# Patient Record
Sex: Female | Born: 1980 | Race: Black or African American | Hispanic: No | Marital: Married | Smoking: Never smoker
Health system: Southern US, Community
[De-identification: ages and names within clinical notes are randomized; demographics above are authoritative.]

## PROBLEM LIST (undated history)

## (undated) ENCOUNTER — Emergency Department (HOSPITAL_COMMUNITY): Admission: EM | Payer: 59

## (undated) DIAGNOSIS — G43909 Migraine, unspecified, not intractable, without status migrainosus: Secondary | ICD-10-CM

## (undated) DIAGNOSIS — J45909 Unspecified asthma, uncomplicated: Secondary | ICD-10-CM

## (undated) HISTORY — PX: FOOT SURGERY: SHX648

## (undated) HISTORY — PX: TUBAL LIGATION: SHX77

---

## 2007-07-04 ENCOUNTER — Emergency Department (HOSPITAL_COMMUNITY): Admission: EM | Admit: 2007-07-04 | Discharge: 2007-07-04 | Payer: Self-pay | Admitting: Emergency Medicine

## 2007-08-22 ENCOUNTER — Emergency Department (HOSPITAL_COMMUNITY): Admission: EM | Admit: 2007-08-22 | Discharge: 2007-08-22 | Payer: Self-pay | Admitting: Emergency Medicine

## 2007-10-25 ENCOUNTER — Emergency Department (HOSPITAL_COMMUNITY): Admission: EM | Admit: 2007-10-25 | Discharge: 2007-10-25 | Payer: Self-pay | Admitting: Emergency Medicine

## 2007-11-11 ENCOUNTER — Ambulatory Visit: Payer: Self-pay | Admitting: Family Medicine

## 2007-11-29 ENCOUNTER — Inpatient Hospital Stay (HOSPITAL_COMMUNITY): Admission: RE | Admit: 2007-11-29 | Discharge: 2007-11-29 | Payer: Self-pay | Admitting: Obstetrics and Gynecology

## 2007-12-09 ENCOUNTER — Ambulatory Visit: Payer: Self-pay | Admitting: *Deleted

## 2008-02-06 ENCOUNTER — Ambulatory Visit (HOSPITAL_COMMUNITY): Admission: RE | Admit: 2008-02-06 | Discharge: 2008-02-06 | Payer: Self-pay | Admitting: Family Medicine

## 2008-04-21 ENCOUNTER — Ambulatory Visit: Payer: Self-pay | Admitting: Physician Assistant

## 2008-04-21 ENCOUNTER — Inpatient Hospital Stay (HOSPITAL_COMMUNITY): Admission: AD | Admit: 2008-04-21 | Discharge: 2008-04-21 | Payer: Self-pay | Admitting: Gynecology

## 2008-07-02 ENCOUNTER — Ambulatory Visit: Payer: Self-pay | Admitting: Family Medicine

## 2008-07-02 ENCOUNTER — Inpatient Hospital Stay (HOSPITAL_COMMUNITY): Admission: AD | Admit: 2008-07-02 | Discharge: 2008-07-04 | Payer: Self-pay | Admitting: Family Medicine

## 2008-09-25 ENCOUNTER — Emergency Department (HOSPITAL_COMMUNITY): Admission: EM | Admit: 2008-09-25 | Discharge: 2008-09-25 | Payer: Self-pay | Admitting: Emergency Medicine

## 2008-10-06 ENCOUNTER — Emergency Department (HOSPITAL_COMMUNITY): Admission: EM | Admit: 2008-10-06 | Discharge: 2008-10-06 | Payer: Self-pay | Admitting: Emergency Medicine

## 2008-12-14 ENCOUNTER — Ambulatory Visit: Payer: Self-pay | Admitting: Internal Medicine

## 2008-12-14 ENCOUNTER — Encounter: Payer: Self-pay | Admitting: Internal Medicine

## 2008-12-14 DIAGNOSIS — J45909 Unspecified asthma, uncomplicated: Secondary | ICD-10-CM | POA: Insufficient documentation

## 2009-02-26 ENCOUNTER — Ambulatory Visit: Payer: Self-pay | Admitting: Family Medicine

## 2009-04-08 ENCOUNTER — Ambulatory Visit (HOSPITAL_COMMUNITY): Admission: RE | Admit: 2009-04-08 | Discharge: 2009-04-08 | Payer: Self-pay | Admitting: General Surgery

## 2009-06-09 ENCOUNTER — Encounter (INDEPENDENT_AMBULATORY_CARE_PROVIDER_SITE_OTHER): Payer: Self-pay | Admitting: Adult Health

## 2009-06-09 ENCOUNTER — Ambulatory Visit: Payer: Self-pay | Admitting: Internal Medicine

## 2009-06-09 LAB — CONVERTED CEMR LAB
ALT: 9 units/L (ref 0–35)
Alkaline Phosphatase: 46 units/L (ref 39–117)
Basophils Absolute: 0 10*3/uL (ref 0.0–0.1)
Basophils Relative: 0 % (ref 0–1)
Creatinine, Ser: 0.72 mg/dL (ref 0.40–1.20)
Eosinophils Absolute: 0.1 10*3/uL (ref 0.0–0.7)
Glucose, Bld: 103 mg/dL — ABNORMAL HIGH (ref 70–99)
Hgb A1c MFr Bld: 6.3 % — ABNORMAL HIGH (ref 4.6–6.1)
MCHC: 31.5 g/dL (ref 30.0–36.0)
Neutro Abs: 2.8 10*3/uL (ref 1.7–7.7)
Neutrophils Relative %: 56 % (ref 43–77)
Platelets: 414 10*3/uL — ABNORMAL HIGH (ref 150–400)
RDW: 15 % (ref 11.5–15.5)
Sodium: 141 meq/L (ref 135–145)
T3 Uptake Ratio: 40.8 % — ABNORMAL HIGH (ref 22.5–37.0)
Total Bilirubin: 0.3 mg/dL (ref 0.3–1.2)
Total Protein: 7.3 g/dL (ref 6.0–8.3)

## 2009-09-13 ENCOUNTER — Ambulatory Visit: Payer: Self-pay | Admitting: Internal Medicine

## 2009-12-06 ENCOUNTER — Emergency Department (HOSPITAL_COMMUNITY): Admission: EM | Admit: 2009-12-06 | Discharge: 2009-12-06 | Payer: Self-pay | Admitting: Emergency Medicine

## 2010-01-02 ENCOUNTER — Emergency Department (HOSPITAL_COMMUNITY): Admission: EM | Admit: 2010-01-02 | Discharge: 2010-01-02 | Payer: Self-pay | Admitting: Emergency Medicine

## 2010-02-07 ENCOUNTER — Emergency Department (HOSPITAL_COMMUNITY): Admission: EM | Admit: 2010-02-07 | Discharge: 2010-02-07 | Payer: Self-pay | Admitting: Emergency Medicine

## 2010-02-14 ENCOUNTER — Ambulatory Visit: Payer: Self-pay | Admitting: Internal Medicine

## 2010-05-07 ENCOUNTER — Emergency Department (HOSPITAL_COMMUNITY): Admission: EM | Admit: 2010-05-07 | Discharge: 2010-05-07 | Payer: Self-pay | Admitting: Emergency Medicine

## 2010-09-21 ENCOUNTER — Emergency Department (HOSPITAL_COMMUNITY): Payer: Self-pay

## 2010-09-21 ENCOUNTER — Emergency Department (HOSPITAL_COMMUNITY)
Admission: EM | Admit: 2010-09-21 | Discharge: 2010-09-21 | Disposition: A | Discharge status: Home / Self Care | Payer: Self-pay | Attending: Emergency Medicine | Admitting: Emergency Medicine

## 2010-09-21 DIAGNOSIS — R0989 Other specified symptoms and signs involving the circulatory and respiratory systems: Secondary | ICD-10-CM | POA: Insufficient documentation

## 2010-09-21 DIAGNOSIS — R079 Chest pain, unspecified: Secondary | ICD-10-CM | POA: Insufficient documentation

## 2010-10-28 LAB — COMPREHENSIVE METABOLIC PANEL
ALT: 12 U/L (ref 0–35)
AST: 14 U/L (ref 0–37)
Albumin: 3.7 g/dL (ref 3.5–5.2)
Alkaline Phosphatase: 52 U/L (ref 39–117)
BUN: 8 mg/dL (ref 6–23)
Chloride: 105 mEq/L (ref 96–112)
Potassium: 3.4 mEq/L — ABNORMAL LOW (ref 3.5–5.1)
Sodium: 138 mEq/L (ref 135–145)
Total Bilirubin: 0.5 mg/dL (ref 0.3–1.2)
Total Protein: 7.3 g/dL (ref 6.0–8.3)

## 2010-10-28 LAB — DIFFERENTIAL
Basophils Absolute: 0 10*3/uL (ref 0.0–0.1)
Basophils Relative: 1 % (ref 0–1)
Eosinophils Absolute: 0.2 10*3/uL (ref 0.0–0.7)
Eosinophils Relative: 6 % — ABNORMAL HIGH (ref 0–5)
Monocytes Absolute: 0.4 10*3/uL (ref 0.1–1.0)
Monocytes Relative: 11 % (ref 3–12)
Neutro Abs: 1.6 10*3/uL — ABNORMAL LOW (ref 1.7–7.7)

## 2010-10-28 LAB — CBC
HCT: 41.2 % (ref 36.0–46.0)
Platelets: 243 10*3/uL (ref 150–400)
RDW: 15.6 % — ABNORMAL HIGH (ref 11.5–15.5)
WBC: 3.9 10*3/uL — ABNORMAL LOW (ref 4.0–10.5)

## 2010-10-28 LAB — PREGNANCY, URINE: Preg Test, Ur: NEGATIVE

## 2010-10-28 LAB — APTT: aPTT: 30 seconds (ref 24–37)

## 2010-11-03 LAB — WET PREP, GENITAL
Trich, Wet Prep: NONE SEEN
Yeast Wet Prep HPF POC: NONE SEEN

## 2010-11-03 LAB — URINALYSIS, ROUTINE W REFLEX MICROSCOPIC
Bilirubin Urine: NEGATIVE
Glucose, UA: NEGATIVE mg/dL
Hgb urine dipstick: NEGATIVE
Ketones, ur: NEGATIVE mg/dL
Nitrite: NEGATIVE
Specific Gravity, Urine: 1.018 (ref 1.005–1.030)
pH: 7 (ref 5.0–8.0)

## 2010-11-03 LAB — GC/CHLAMYDIA PROBE AMP, GENITAL
Chlamydia, DNA Probe: NEGATIVE
GC Probe Amp, Genital: NEGATIVE

## 2010-12-06 NOTE — Op Note (Signed)
Madeline Kelly, Madeline Kelly               ACCOUNT NO.:  [id number]   MEDICAL RECORD NO.:  [id number]          PATIENT TYPE:  INP   LOCATION:  9112                          FACILITY:  [facility]   PHYSICIAN:  Norton Blizzard, MD    DATE OF BIRTH:  04/22/1981   DATE OF PROCEDURE:  DATE OF DISCHARGE:                               OPERATIVE REPORT   PREOPERATIVE DIAGNOSES:  1. Multiparity.  2. Undesired fertility.   POSTOPERATIVE DIAGNOSES:  1. Multiparity.  2. Undesired fertility.   PROCEDURE:  Bilateral tubal sterilization with Filshie clips.   SURGEON:  Norton Blizzard, MD   ASSISTANT:  Odie Sera, DO   ANESTHESIA:  General with local.   REASON FOR PROCEDURE:  [name] is a multiparous female, who is  status post spontaneous vaginal delivery yesterday, who has desired an  elective bilateral tubal sterilization procedure.  She has previously  been counseled on risks and benefits of this procedure to include, but  not limited to bleeding, infection, damage to internal organs, as well  as the potential failure of the procedure being approximately 5 to 7 in  1000 with an increased risk of ectopic pregnancy in the event if failure  did occur.  Her 30-day tubal papers are signed and on the chart and are  over 74 days' old.   DESCRIPTION OF PROCEDURE:  The patient was taken to the operating room,  where general anesthesia was introduced.  She was prepped and draped in  usual sterile manner.  A time-out was conducted.  A 3-cm skin incision  was made just inferior to the umbilicus and this incision was extended  downward using the scalpel to the fascial layer.  The fascia was grasped  and cut in the midline with Metzenbaum scissors.  The fascial incision  was extended laterally with the Metzenbaum scissors.  The peritoneum was  entered bluntly and the right fallopian tube was easily identified,  grasped with a Babcock clamp.  It was followed distally out to  identification of  fimbria.  A Filshie clip was then placed approximately  2 cm from the cornu of the uterus in the usual manner.  Good hemostasis  was noted and the tube was returned to the abdomen.  The left fallopian  tube was then identified and grasped with the Babcock clamp.  It was  followed distally to identification of the fimbria.  The tube was then  clamped with a Filshie clip in the usual manner.  Good hemostasis was  noted.  The tube was returned to the abdomen.  The fascia was then  closed in the usual manner using 0 Vicryl in a running non-interlocking  fashion.  Good hemostasis was noted.  No defects were noted.  The skin  was then closed using 2-0 Vicryl in a running subcutaneous fashion.  The  incision was then injected with 10 mL of 0.25% Marcaine without  epinephrine.   FINDINGS:  Bilateral fallopian tubes, grossly normal.   SPECIMENS:  None.   ESTIMATED BLOOD LOSS:  Minimal.  There are no immediate complications.  The patient was  taken to PACU in good condition.      Odie Sera, DO  Electronically Signed     ______________________________  Norton Blizzard, MD    MC/MEDQ  D:  07/03/2008  T:  07/04/2008  Job:  [id number]

## 2010-12-09 ENCOUNTER — Other Ambulatory Visit: Payer: Self-pay | Admitting: Family Medicine

## 2010-12-09 DIAGNOSIS — N63 Unspecified lump in unspecified breast: Secondary | ICD-10-CM

## 2010-12-12 ENCOUNTER — Ambulatory Visit
Admission: RE | Admit: 2010-12-12 | Discharge: 2010-12-12 | Disposition: A | Discharge status: Home / Self Care | Payer: Self-pay | Source: Ambulatory Visit | Attending: Family Medicine | Admitting: Family Medicine

## 2010-12-12 DIAGNOSIS — N63 Unspecified lump in unspecified breast: Secondary | ICD-10-CM

## 2011-01-25 ENCOUNTER — Emergency Department (HOSPITAL_COMMUNITY)
Admission: EM | Admit: 2011-01-25 | Discharge: 2011-01-26 | Disposition: A | Discharge status: Home / Self Care | Payer: Self-pay | Attending: Emergency Medicine | Admitting: Emergency Medicine

## 2011-01-25 DIAGNOSIS — J45909 Unspecified asthma, uncomplicated: Secondary | ICD-10-CM | POA: Insufficient documentation

## 2011-01-25 DIAGNOSIS — R05 Cough: Secondary | ICD-10-CM | POA: Insufficient documentation

## 2011-01-25 DIAGNOSIS — R059 Cough, unspecified: Secondary | ICD-10-CM | POA: Insufficient documentation

## 2011-04-24 LAB — URINALYSIS, ROUTINE W REFLEX MICROSCOPIC
Bilirubin Urine: NEGATIVE
Ketones, ur: 15 — AB
Nitrite: NEGATIVE
Specific Gravity, Urine: 1.01
Urobilinogen, UA: 0.2

## 2011-04-24 LAB — WET PREP, GENITAL: Yeast Wet Prep HPF POC: NONE SEEN

## 2011-04-24 LAB — RAPID URINE DRUG SCREEN, HOSP PERFORMED: Tetrahydrocannabinol: NOT DETECTED

## 2011-04-24 LAB — FETAL FIBRONECTIN: Fetal Fibronectin: NEGATIVE

## 2011-04-28 LAB — CBC
HCT: 34.3 % — ABNORMAL LOW (ref 36.0–46.0)
Hemoglobin: 11.1 g/dL — ABNORMAL LOW (ref 12.0–15.0)
MCV: 77.7 fL — ABNORMAL LOW (ref 78.0–100.0)
WBC: 8.8 10*3/uL (ref 4.0–10.5)

## 2014-04-22 ENCOUNTER — Emergency Department (HOSPITAL_COMMUNITY)
Admission: EM | Admit: 2014-04-22 | Discharge: 2014-04-22 | Disposition: A | Discharge status: Home / Self Care | Payer: 59 | Source: Home / Self Care

## 2014-04-22 ENCOUNTER — Emergency Department (HOSPITAL_COMMUNITY)
Admission: EM | Admit: 2014-04-22 | Discharge: 2014-04-23 | Disposition: A | Discharge status: Home / Self Care | Payer: 59 | Attending: Emergency Medicine | Admitting: Emergency Medicine

## 2014-04-22 DIAGNOSIS — R109 Unspecified abdominal pain: Secondary | ICD-10-CM | POA: Diagnosis present

## 2014-04-22 DIAGNOSIS — Z8669 Personal history of other diseases of the nervous system and sense organs: Secondary | ICD-10-CM | POA: Insufficient documentation

## 2014-04-22 DIAGNOSIS — R1032 Left lower quadrant pain: Secondary | ICD-10-CM | POA: Insufficient documentation

## 2014-04-22 DIAGNOSIS — N83209 Unspecified ovarian cyst, unspecified side: Secondary | ICD-10-CM | POA: Diagnosis not present

## 2014-04-22 DIAGNOSIS — R1030 Lower abdominal pain, unspecified: Secondary | ICD-10-CM | POA: Insufficient documentation

## 2014-04-22 DIAGNOSIS — K5901 Slow transit constipation: Secondary | ICD-10-CM | POA: Diagnosis not present

## 2014-04-22 DIAGNOSIS — Z3202 Encounter for pregnancy test, result negative: Secondary | ICD-10-CM | POA: Insufficient documentation

## 2014-04-22 DIAGNOSIS — Z79899 Other long term (current) drug therapy: Secondary | ICD-10-CM | POA: Insufficient documentation

## 2014-04-22 DIAGNOSIS — N8320 Unspecified ovarian cysts: Secondary | ICD-10-CM | POA: Insufficient documentation

## 2014-04-22 DIAGNOSIS — R1031 Right lower quadrant pain: Secondary | ICD-10-CM | POA: Diagnosis not present

## 2014-04-22 HISTORY — DX: Migraine, unspecified, not intractable, without status migrainosus: G43.909

## 2014-04-22 LAB — CBC WITH DIFFERENTIAL/PLATELET
BASOS ABS: 0 10*3/uL (ref 0.0–0.1)
BASOS PCT: 0 % (ref 0–1)
Eosinophils Absolute: 0.2 10*3/uL (ref 0.0–0.7)
Eosinophils Relative: 4 % (ref 0–5)
HEMATOCRIT: 41.5 % (ref 36.0–46.0)
Hemoglobin: 13.5 g/dL (ref 12.0–15.0)
LYMPHS PCT: 39 % (ref 12–46)
Lymphs Abs: 2.1 10*3/uL (ref 0.7–4.0)
MCH: 26.8 pg (ref 26.0–34.0)
MCHC: 32.5 g/dL (ref 30.0–36.0)
MCV: 82.5 fL (ref 78.0–100.0)
MONO ABS: 0.4 10*3/uL (ref 0.1–1.0)
Monocytes Relative: 7 % (ref 3–12)
NEUTROS ABS: 2.7 10*3/uL (ref 1.7–7.7)
NEUTROS PCT: 50 % (ref 43–77)
PLATELETS: 268 10*3/uL (ref 150–400)
RBC: 5.03 MIL/uL (ref 3.87–5.11)
RDW: 14.3 % (ref 11.5–15.5)
WBC: 5.4 10*3/uL (ref 4.0–10.5)

## 2014-04-22 MED ORDER — SODIUM CHLORIDE 0.9 % IV SOLN
1000.0000 mL | INTRAVENOUS | Status: DC
  Administered 2014-04-22: 1000 mL via INTRAVENOUS

## 2014-04-22 MED ORDER — ONDANSETRON HCL 4 MG/2ML IJ SOLN
4.0000 mg | Freq: Once | INTRAMUSCULAR | Status: DC
  Filled 2014-04-22: qty 2

## 2014-04-22 MED ORDER — HYDROMORPHONE HCL 1 MG/ML IJ SOLN
1.0000 mg | Freq: Once | INTRAMUSCULAR | Status: DC
Start: 2014-04-22 — End: 2014-04-23

## 2014-04-22 NOTE — ED Notes (Signed)
Pt complains of lower abd pain since yesterday, she states today its worse, pt very tearful, no injury, no bleeding, no injury

## 2014-04-22 NOTE — ED Notes (Signed)
Pt states she would like to hold off on another IV, pt states if she needs IV fluids then will get another one.

## 2014-04-22 NOTE — ED Notes (Signed)
Pt states cannot give urine sample at this time, pt given water per her request

## 2014-04-23 ENCOUNTER — Encounter (HOSPITAL_COMMUNITY): Payer: Self-pay | Admitting: Emergency Medicine

## 2014-04-23 ENCOUNTER — Emergency Department (HOSPITAL_COMMUNITY): Payer: 59

## 2014-04-23 DIAGNOSIS — R1032 Left lower quadrant pain: Secondary | ICD-10-CM | POA: Diagnosis not present

## 2014-04-23 DIAGNOSIS — Z79899 Other long term (current) drug therapy: Secondary | ICD-10-CM | POA: Diagnosis not present

## 2014-04-23 DIAGNOSIS — R1030 Lower abdominal pain, unspecified: Secondary | ICD-10-CM | POA: Diagnosis present

## 2014-04-23 DIAGNOSIS — N8320 Unspecified ovarian cysts: Secondary | ICD-10-CM | POA: Diagnosis not present

## 2014-04-23 DIAGNOSIS — K5901 Slow transit constipation: Secondary | ICD-10-CM | POA: Diagnosis not present

## 2014-04-23 DIAGNOSIS — Z8669 Personal history of other diseases of the nervous system and sense organs: Secondary | ICD-10-CM | POA: Diagnosis not present

## 2014-04-23 DIAGNOSIS — Z3202 Encounter for pregnancy test, result negative: Secondary | ICD-10-CM | POA: Diagnosis not present

## 2014-04-23 DIAGNOSIS — R1031 Right lower quadrant pain: Secondary | ICD-10-CM | POA: Diagnosis not present

## 2014-04-23 LAB — URINALYSIS, ROUTINE W REFLEX MICROSCOPIC
Bilirubin Urine: NEGATIVE
Glucose, UA: NEGATIVE mg/dL
HGB URINE DIPSTICK: NEGATIVE
Ketones, ur: NEGATIVE mg/dL
Leukocytes, UA: NEGATIVE
Nitrite: NEGATIVE
PH: 6 (ref 5.0–8.0)
PROTEIN: NEGATIVE mg/dL
Specific Gravity, Urine: 1.022 (ref 1.005–1.030)
Urobilinogen, UA: 1 mg/dL (ref 0.0–1.0)

## 2014-04-23 LAB — CBC WITH DIFFERENTIAL/PLATELET
BASOS ABS: 0 10*3/uL (ref 0.0–0.1)
Basophils Relative: 0 % (ref 0–1)
Eosinophils Absolute: 0.2 10*3/uL (ref 0.0–0.7)
Eosinophils Relative: 2 % (ref 0–5)
HCT: 38.4 % (ref 36.0–46.0)
Hemoglobin: 12.4 g/dL (ref 12.0–15.0)
LYMPHS ABS: 1.5 10*3/uL (ref 0.7–4.0)
LYMPHS PCT: 19 % (ref 12–46)
MCH: 26.6 pg (ref 26.0–34.0)
MCHC: 32.3 g/dL (ref 30.0–36.0)
MCV: 82.2 fL (ref 78.0–100.0)
Monocytes Absolute: 0.5 10*3/uL (ref 0.1–1.0)
Monocytes Relative: 6 % (ref 3–12)
Neutro Abs: 5.9 10*3/uL (ref 1.7–7.7)
Neutrophils Relative %: 73 % (ref 43–77)
PLATELETS: 265 10*3/uL (ref 150–400)
RBC: 4.67 MIL/uL (ref 3.87–5.11)
RDW: 14.3 % (ref 11.5–15.5)
WBC: 8.1 10*3/uL (ref 4.0–10.5)

## 2014-04-23 LAB — COMPREHENSIVE METABOLIC PANEL
ALBUMIN: 3.6 g/dL (ref 3.5–5.2)
ALK PHOS: 45 U/L (ref 39–117)
ALK PHOS: 44 U/L (ref 39–117)
ALT: 20 U/L (ref 0–35)
ALT: 18 U/L (ref 0–35)
AST: 16 U/L (ref 0–37)
AST: 14 U/L (ref 0–37)
Albumin: 3.6 g/dL (ref 3.5–5.2)
Anion gap: 15 (ref 5–15)
Anion gap: 14 (ref 5–15)
BILIRUBIN TOTAL: 0.3 mg/dL (ref 0.3–1.2)
BUN: 9 mg/dL (ref 6–23)
BUN: 9 mg/dL (ref 6–23)
CHLORIDE: 105 meq/L (ref 96–112)
CO2: 21 meq/L (ref 19–32)
CO2: 22 meq/L (ref 19–32)
CREATININE: 0.74 mg/dL (ref 0.50–1.10)
Calcium: 8.7 mg/dL (ref 8.4–10.5)
Calcium: 8.4 mg/dL (ref 8.4–10.5)
Chloride: 103 mEq/L (ref 96–112)
Creatinine, Ser: 0.72 mg/dL (ref 0.50–1.10)
GFR calc Af Amer: >90 mL/min (ref >90)
Glucose, Bld: 113 mg/dL — ABNORMAL HIGH (ref 70–99)
Glucose, Bld: 100 mg/dL — ABNORMAL HIGH (ref 70–99)
POTASSIUM: 3.9 meq/L (ref 3.7–5.3)
POTASSIUM: 3.9 meq/L (ref 3.7–5.3)
SODIUM: 141 meq/L (ref 137–147)
SODIUM: 139 meq/L (ref 137–147)
Total Bilirubin: 0.3 mg/dL (ref 0.3–1.2)
Total Protein: 7.7 g/dL (ref 6.0–8.3)
Total Protein: 7.3 g/dL (ref 6.0–8.3)

## 2014-04-23 LAB — HIV ANTIBODY (ROUTINE TESTING W REFLEX)
HIV 1&2 Ab, 4th Generation: NONREACTIVE
HIV: NONREACTIVE

## 2014-04-23 LAB — GC/CHLAMYDIA PROBE AMP
CT Probe RNA: NEGATIVE
GC Probe RNA: NEGATIVE

## 2014-04-23 LAB — WET PREP, GENITAL
Trich, Wet Prep: NONE SEEN
Yeast Wet Prep HPF POC: NONE SEEN

## 2014-04-23 LAB — RPR

## 2014-04-23 LAB — LIPASE, BLOOD
LIPASE: 19 U/L (ref 11–59)
Lipase: 18 U/L (ref 11–59)

## 2014-04-23 LAB — POC URINE PREG, ED: PREG TEST UR: NEGATIVE

## 2014-04-23 MED ORDER — POLYETHYLENE GLYCOL 3350 17 G PO PACK: 17.0000 g | PACK | Freq: Every day | ORAL | Status: AC

## 2014-04-23 MED ORDER — TRAMADOL HCL 50 MG PO TABS: 50.0000 mg | ORAL_TABLET | Freq: Four times a day (QID) | ORAL | Status: AC | PRN

## 2014-04-23 MED ORDER — METRONIDAZOLE 500 MG PO TABS: 500.0000 mg | ORAL_TABLET | Freq: Two times a day (BID) | ORAL | Status: AC

## 2014-04-23 MED ORDER — CEFTRIAXONE SODIUM 250 MG IJ SOLR: 250.0000 mg | Freq: Once | INTRAMUSCULAR | Status: DC

## 2014-04-23 MED ORDER — LACTULOSE 10 GM/15ML PO SOLN
30.0000 g | Freq: Once | ORAL | Status: DC
  Filled 2014-04-23: qty 45

## 2014-04-23 MED ORDER — DOXYCYCLINE HYCLATE 50 MG PO CAPS: 100.0000 mg | ORAL_CAPSULE | Freq: Two times a day (BID) | ORAL | Status: AC

## 2014-04-23 MED ORDER — BISACODYL 10 MG RE SUPP
10.0000 mg | Freq: Once | RECTAL | Status: DC
  Filled 2014-04-23: qty 1

## 2014-04-23 NOTE — ED Notes (Signed)
US at bedside

## 2014-04-23 NOTE — Discharge Instructions (Signed)
1. Medications: miralax, usual home medications 2. Treatment: rest, drink plenty of fluids,  3. Follow Up: Please followup with your primary doctor for discussion of your diagnoses and further evaluation after today's visit; if you do not have a primary care doctor use the resource guide provided to find one;     Abdominal Pain Many things can cause abdominal pain. Usually, abdominal pain is not caused by a disease and will improve without treatment. It can often be observed and treated at home. Your health care provider will do a physical exam and possibly order blood tests and X-rays to help determine the seriousness of your pain. However, in many cases, more time must pass before a clear cause of the pain can be found. Before that point, your health care provider may not know if you need more testing or further treatment. HOME CARE INSTRUCTIONS  Monitor your abdominal pain for any changes. The following actions may help to alleviate any discomfort you are experiencing:  Only take over-the-counter or prescription medicines as directed by your health care provider.  Do not take laxatives unless directed to do so by your health care provider.  Try a clear liquid diet (broth, tea, or water) as directed by your health care provider. Slowly move to a bland diet as tolerated. SEEK MEDICAL CARE IF:  You have unexplained abdominal pain.  You have abdominal pain associated with nausea or diarrhea.  You have pain when you urinate or have a bowel movement.  You experience abdominal pain that wakes you in the night.  You have abdominal pain that is worsened or improved by eating food.  You have abdominal pain that is worsened with eating fatty foods.  You have a fever. SEEK IMMEDIATE MEDICAL CARE IF:   Your pain does not go away within 2 hours.  You keep throwing up (vomiting).  Your pain is felt only in portions of the abdomen, such as the right side or the left lower portion of the  abdomen.  You pass bloody or black tarry stools. MAKE SURE YOU:  Understand these instructions.   Will watch your condition.   Will get help right away if you are not doing well or get worse.  Document Released: 04/19/2005 Document Revised: 07/15/2013 Document Reviewed: 03/19/2013 [facility] Patient Information 2015 [place], [place]. This information is not intended to replace advice given to you by your health care provider. Make sure you discuss any questions you have with your health care provider.   Ovarian Cyst An ovarian cyst is a fluid-filled sac that forms on an ovary. The ovaries are small organs that produce eggs in women. Various types of cysts can form on the ovaries. Most are not cancerous. Many do not cause problems, and they often go away on their own. Some may cause symptoms and require treatment. Common types of ovarian cysts include:  Functional cysts--These cysts may occur every month during the menstrual cycle. This is normal. The cysts usually go away with the next menstrual cycle if the woman does not get pregnant. Usually, there are no symptoms with a functional cyst.  Endometrioma cysts--These cysts form from the tissue that lines the uterus. They are also called "chocolate cysts" because they become filled with blood that turns brown. This type of cyst can cause pain in the lower abdomen during intercourse and with your menstrual period.  Cystadenoma cysts--This type develops from the cells on the outside of the ovary. These cysts can get very big and cause lower abdomen  pain and pain with intercourse. This type of cyst can twist on itself, cut off its blood supply, and cause severe pain. It can also easily rupture and cause a lot of pain.  Dermoid cysts--This type of cyst is sometimes found in both ovaries. These cysts may contain different kinds of body tissue, such as skin, teeth, hair, or cartilage. They usually do not cause symptoms unless they get very  big.  Theca lutein cysts--These cysts occur when too much of a certain hormone (human chorionic gonadotropin) is produced and overstimulates the ovaries to produce an egg. This is most common after procedures used to assist with the conception of a baby (in vitro fertilization). CAUSES   Fertility drugs can cause a condition in which multiple large cysts are formed on the ovaries. This is called ovarian hyperstimulation syndrome.  A condition called polycystic ovary syndrome can cause hormonal imbalances that can lead to nonfunctional ovarian cysts. SIGNS AND SYMPTOMS  Many ovarian cysts do not cause symptoms. If symptoms are present, they may include:  Pelvic pain or pressure.  Pain in the lower abdomen.  Pain during sexual intercourse.  Increasing girth (swelling) of the abdomen.  Abnormal menstrual periods.  Increasing pain with menstrual periods.  Stopping having menstrual periods without being pregnant. DIAGNOSIS  These cysts are commonly found during a routine or annual pelvic exam. Tests may be ordered to find out more about the cyst. These tests may include:  Ultrasound.  X-ray of the pelvis.  CT scan.  MRI.  Blood tests. TREATMENT  Many ovarian cysts go away on their own without treatment. Your health care provider may want to check your cyst regularly for 2-3 months to see if it changes. For women in menopause, it is particularly important to monitor a cyst closely because of the higher rate of ovarian cancer in menopausal women. When treatment is needed, it may include any of the following:  A procedure to drain the cyst (aspiration). This may be done using a long needle and ultrasound. It can also be done through a laparoscopic procedure. This involves using a thin, lighted tube with a tiny camera on the end (laparoscope) inserted through a small incision.  Surgery to remove the whole cyst. This may be done using laparoscopic surgery or an open surgery involving a  larger incision in the lower abdomen.  Hormone treatment or birth control pills. These methods are sometimes used to help dissolve a cyst. HOME CARE INSTRUCTIONS   Only take over-the-counter or prescription medicines as directed by your health care provider.  Follow up with your health care provider as directed.  Get regular pelvic exams and Pap tests. SEEK MEDICAL CARE IF:   Your periods are late, irregular, or painful, or they stop.  Your pelvic pain or abdominal pain does not go away.  Your abdomen becomes larger or swollen.  You have pressure on your bladder or trouble emptying your bladder completely.  You have pain during sexual intercourse.  You have feelings of fullness, pressure, or discomfort in your stomach.  You lose weight for no apparent reason.  You feel generally ill.  You become constipated.  You lose your appetite.  You develop acne.  You have an increase in body and facial hair.  You are gaining weight, without changing your exercise and eating habits.  You think you are pregnant. SEEK IMMEDIATE MEDICAL CARE IF:   You have increasing abdominal pain.  You feel sick to your stomach (nauseous), and you throw up (  vomit).  You develop a fever that comes on suddenly.  You have abdominal pain during a bowel movement.  Your menstrual periods become heavier than usual. MAKE SURE YOU:  Understand these instructions.  Will watch your condition.  Will get help right away if you are not doing well or get worse. Document Released: 07/10/2005 Document Revised: 07/15/2013 Document Reviewed: 03/17/2013 [facility] Patient Information 2015 [place], [place]. This information is not intended to replace advice given to you by your health care provider. Make sure you discuss any questions you have with your health care provider.

## 2014-04-23 NOTE — ED Notes (Signed)
Patient transported to X-ray 

## 2014-04-23 NOTE — ED Notes (Signed)
Pt does not want any pain or nausea medication at this time, states she would prefer not to have an IV either, PA made aware.

## 2014-04-23 NOTE — ED Notes (Signed)
Pt states having lower abdominal pain/cramping, states also hurting on R side buttox, has not had a BM in 3 days, states she thinks she's constipated, has not taken anything for constipation, denies n/v/d.

## 2014-04-23 NOTE — ED Notes (Signed)
Pt very rude when RN walked in w/ discharge papers and medication, states I want my paperwork now and I want to leave, pt refused to let RN get vital signs or sign that paperwork was given, pt slammed door open when she left.

## 2014-04-23 NOTE — ED Provider Notes (Signed)
CSN: [id number]     Arrival date & time 04/22/14  2355 History   First MD Initiated Contact with Patient 04/23/14 0016     Chief Complaint  Patient presents with  . Abdominal Pain     (Consider location/radiation/quality/duration/timing/severity/associated sxs/prior Treatment) Patient is a 33 y.o. female presenting with abdominal pain. The history is provided by the patient and medical records. No language interpreter was used.  Abdominal Pain Associated symptoms: constipation   Associated symptoms: no chest pain, no cough, no diarrhea, no dysuria, no fatigue, no fever, no hematuria, no nausea, no shortness of breath and no vomiting     Madeline Kelly is a 33 y.o. female [phone] [id number] with a hx of migraine headaches, BTL (2009) presents to the Emergency Department complaining of gradual, persistent, progressively worsening lower abd pain onset yesterday morning. Pt reports the pain is located in the center, waxing and waning, rated at 7/10, described as cramping in nature. No associated symptoms. Nothing makes it better and nothing makes it worse. Pt denies fever, chills, headache, neck pain, chest pain, SOB, N/V/D, weakness, dizziness, syncope, dysuria, hematuria, vaginal discharge. LMP: 2nd week of Sept.  Pt reports chronic constipation and straining for a BM.  Pt reports last BM was 4 days ago.     Past Medical History  Diagnosis Date  . Migraines    Past Surgical History  Procedure Laterality Date  . Tubal ligation     No family history on file. History  Substance Use Topics  . Smoking status: Unknown If Ever Smoked  . Smokeless tobacco: Not on file  . Alcohol Use: Yes   OB History   Grav Para Term Preterm Abortions TAB SAB Ect Mult Living                 Review of Systems  Constitutional: Negative for fever, diaphoresis, appetite change, fatigue and unexpected weight change.  HENT: Negative for mouth sores and trouble swallowing.   Respiratory: Negative for cough, chest  tightness, shortness of breath, wheezing and stridor.   Cardiovascular: Negative for chest pain and palpitations.  Gastrointestinal: Positive for abdominal pain and constipation. Negative for nausea, vomiting, diarrhea, blood in stool, abdominal distention and rectal pain.  Genitourinary: Negative for dysuria, urgency, frequency, hematuria, flank pain and difficulty urinating.  Musculoskeletal: Negative for back pain, neck pain and neck stiffness.  Skin: Negative for rash.  Neurological: Negative for weakness.  Hematological: Negative for adenopathy.  Psychiatric/Behavioral: Negative for confusion.  All other systems reviewed and are negative.     Allergies  Review of patient's allergies indicates no known allergies.  Home Medications   Prior to Admission medications   Medication Sig Start Date End Date Taking? Authorizing Provider  albuterol (PROVENTIL HFA;VENTOLIN HFA) 108 (90 BASE) MCG/ACT inhaler Inhale 1-2 puffs into the lungs every 6 (six) hours as needed for wheezing or shortness of breath.   Yes Historical Provider, MD  polyethylene glycol (MIRALAX / GLYCOLAX) packet Take 17 g by mouth daily. 04/23/14   Valyncia Wiens, PA-C   BP 133/75  Pulse 103  Temp(Src) 98.3 F (36.8 C) (Oral)  Resp 20  Ht 5\' 6"  (1.676 m)  Wt 170 lb (77.111 kg)  BMI 27.45 kg/m2  SpO2 100%  LMP 04/01/2014 Physical Exam  Nursing note and vitals reviewed. Constitutional: She appears well-developed and well-nourished. No distress.  HENT:  Head: Normocephalic and atraumatic.  Mouth/Throat: Oropharynx is clear and moist.  Eyes: Conjunctivae are normal. No scleral icterus.  Neck: Normal range  of motion.  Cardiovascular: Normal rate, regular rhythm, normal heart sounds and intact distal pulses.   No murmur heard. Pulmonary/Chest: Effort normal and breath sounds normal. No respiratory distress. She has no wheezes.  Abdominal: Soft. Bowel sounds are normal. She exhibits no distension and no mass.  There is tenderness in the right lower quadrant, suprapubic area and left lower quadrant. There is no rebound, no guarding and no CVA tenderness. Hernia confirmed negative in the right inguinal area and confirmed negative in the left inguinal area.  Mild lower abdominal tenderness without guarding, rigidity, peritoneal signs; abdomen is soft No CVA tenderness  Genitourinary: Uterus normal. No labial fusion. There is no rash, tenderness or lesion on the right labia. There is no rash, tenderness or lesion on the left labia. Uterus is not deviated, not enlarged, not fixed and not tender. Cervix exhibits motion tenderness. Cervix exhibits no discharge and no friability. Right adnexum displays no mass, no tenderness and no fullness. Left adnexum displays no mass, no tenderness and no fullness. No erythema, tenderness or bleeding around the vagina. No foreign body around the vagina. No signs of injury around the vagina. No vaginal discharge found.  Mild cervical motion tenderness No discharge in the vaginal vault or from the cervical os No lateralizing pain in the adnexa; mild tenderness bilaterally to the adnexa  Musculoskeletal: Normal range of motion. She exhibits no edema.  Lymphadenopathy:       Right: No inguinal adenopathy present.       Left: No inguinal adenopathy present.  Neurological: She is alert.  Skin: Skin is warm and dry. She is not diaphoretic. No erythema.  Psychiatric: She has a normal mood and affect.    ED Course  Procedures (including critical care time) Labs Review Labs Reviewed  GC/CHLAMYDIA PROBE AMP  WET PREP, GENITAL  CBC WITH DIFFERENTIAL  COMPREHENSIVE METABOLIC PANEL  LIPASE, BLOOD  URINALYSIS, ROUTINE W REFLEX MICROSCOPIC  RPR  HIV ANTIBODY (ROUTINE TESTING)  POC URINE PREG, ED    Imaging Review No results found.   EKG Interpretation None      MDM   Final diagnoses:  Lower abdominal pain  Slow transit constipation   Madeline Kelly presents with  lower abdominal pain for 24 hours. Patient reports no bowel movement for the last 4 days and a long history of constipation. On exam no discharge in the vaginal vault the cervical os however mild non-lateralizing pain to the adnexa bilaterally and mild cervical motion tenderness.  Labs, UA, wet prep, acute abdominal series and pelvic ultrasound are all pending.  Patient has been given medication to help with bowel movement here in the emergency department. She continues to decline fluid bolus and pain medication.  Patient discussed with Junious Silk, PA-C who will follow and disposition accordingly.    BP 133/75  Pulse 103  Temp(Src) 98.3 F (36.8 C) (Oral)  Resp 20  Ht 5\' 6"  (1.676 m)  Wt 170 lb (77.111 kg)  BMI 27.45 kg/m2  SpO2 100%  LMP 04/01/2014   Dierdre Forth, PA-C 04/23/14 0112

## 2014-04-23 NOTE — ED Provider Notes (Signed)
Patient signed out to me by Muthersbaugh, PA-C at change of shift. Lower abdominal for approximately 24 hours. Patient with some cervical motion tenderness on exam. Wet prep pending. Question of if this is stemming from pelvic organs or irriation from constipation. Will follow up labs and imaging.   Wet prep with moderate WBC. Pelvic US shows probable ruptured ovarian cyst. No concern for ectopic pregnancy given negative pregnancy test. Patient has GYN and will f/u. Will treat for PID given moderate WBC. Discussed reasons to return to ED immediately. Vital signs stable for discharge. Patient / Family / Caregiver informed of clinical course, understand medical decision-making process, and agree with plan.    Results for orders placed during the hospital encounter of 04/22/14  WET PREP, GENITAL      Result Value Ref Range   Yeast Wet Prep HPF POC NONE SEEN  NONE SEEN   Trich, Wet Prep NONE SEEN  NONE SEEN   Clue Cells Wet Prep HPF POC RARE (*) NONE SEEN   WBC, Wet Prep HPF POC MODERATE (*) NONE SEEN  CBC WITH DIFFERENTIAL      Result Value Ref Range   WBC 8.1  4.0 - 10.5 K/uL   RBC 4.67  3.87 - 5.11 MIL/uL   Hemoglobin 12.4  12.0 - 15.0 g/dL   HCT 16.1  09.6 - 04.5 %   MCV 82.2  78.0 - 100.0 fL   MCH 26.6  26.0 - 34.0 pg   MCHC 32.3  30.0 - 36.0 g/dL   RDW 40.9  81.1 - 91.4 %   Platelets 265  150 - 400 K/uL   Neutrophils Relative % 73  43 - 77 %   Neutro Abs 5.9  1.7 - 7.7 K/uL   Lymphocytes Relative 19  12 - 46 %   Lymphs Abs 1.5  0.7 - 4.0 K/uL   Monocytes Relative 6  3 - 12 %   Monocytes Absolute 0.5  0.1 - 1.0 K/uL   Eosinophils Relative 2  0 - 5 %   Eosinophils Absolute 0.2  0.0 - 0.7 K/uL   Basophils Relative 0  0 - 1 %   Basophils Absolute 0.0  0.0 - 0.1 K/uL  COMPREHENSIVE METABOLIC PANEL      Result Value Ref Range   Sodium 139  137 - 147 mEq/L   Potassium 3.9  3.7 - 5.3 mEq/L   Chloride 103  96 - 112 mEq/L   CO2 22  19 - 32 mEq/L   Glucose, Bld 100 (*) 70 - 99 mg/dL    BUN 9  6 - 23 mg/dL   Creatinine, Ser 7.82  0.50 - 1.10 mg/dL   Calcium 8.4  8.4 - 95.6 mg/dL   Total Protein 7.3  6.0 - 8.3 g/dL   Albumin 3.6  3.5 - 5.2 g/dL   AST 14  0 - 37 U/L   ALT 18  0 - 35 U/L   Alkaline Phosphatase 44  39 - 117 U/L   Total Bilirubin 0.3  0.3 - 1.2 mg/dL   GFR calc non Af Amer >90  >90 mL/min   GFR calc Af Amer >90  >90 mL/min   Anion gap 14  5 - 15  LIPASE, BLOOD      Result Value Ref Range   Lipase 18  11 - 59 U/L  URINALYSIS, ROUTINE W REFLEX MICROSCOPIC      Result Value Ref Range   Color, Urine YELLOW  YELLOW   APPearance  CLEAR  CLEAR   Specific Gravity, Urine 1.022  1.005 - 1.030   pH 6.0  5.0 - 8.0   Glucose, UA NEGATIVE  NEGATIVE mg/dL   Hgb urine dipstick NEGATIVE  NEGATIVE   Bilirubin Urine NEGATIVE  NEGATIVE   Ketones, ur NEGATIVE  NEGATIVE mg/dL   Protein, ur NEGATIVE  NEGATIVE mg/dL   Urobilinogen, UA 1.0  0.0 - 1.0 mg/dL   Nitrite NEGATIVE  NEGATIVE   Leukocytes, UA NEGATIVE  NEGATIVE  POC URINE PREG, ED      Result Value Ref Range   Preg Test, Ur NEGATIVE  NEGATIVE   [place] Transvaginal Non-ob  04/23/2014   CLINICAL DATA:  Pelvic pain, evaluate for torsion  EXAM: TRANSABDOMINAL AND TRANSVAGINAL ULTRASOUND OF PELVIS  DOPPLER ULTRASOUND OF OVARIES  TECHNIQUE: Both transabdominal and transvaginal ultrasound examinations of the pelvis were performed. Transabdominal technique was performed for global imaging of the pelvis including uterus, ovaries, adnexal regions, and pelvic cul-de-sac.  It was necessary to proceed with endovaginal exam following the transabdominal exam to visualize the right ovary. Color and duplex Doppler ultrasound was utilized to evaluate blood flow to the ovaries.  COMPARISON:  None.  FINDINGS: Uterus  Measurements: 8.6 x 6.0 x 6.3 cm. Dominant 2.6 x 2.2 x 2.8 cm intramural fibroid in the right uterine fundus.  Endometrium  Thickness: 7 mm.  No focal abnormality visualized.  Right ovary  Measurements: 3.4 x 2.1 x 2.9 cm. 1.9 x  1.3 x 1.8 cm involuting corpus luteal cyst. 4.5 x 3.0 x 3.3 cm complex, heterogeneous lesion adjacent to the right ovary, with a heteroechoic appearance which resembles clot (especially given the additional findings).  Left ovary  Measurements: 3.6 x 3.1 x 3.2 cm. Normal appearance/no adnexal mass.  Pulsed Doppler evaluation of both ovaries demonstrates normal low-resistance arterial and venous waveforms.  Other findings  Moderate complex pelvic fluid/hemorrhage.  IMPRESSION: 4.5 cm complex lesion adjacent to the right ovary, favored to reflect clot, with additional complex fluid/hemorrhage in the pelvic cul-de-sac.  In the setting of a positive pregnancy test, these findings would be highly suspicious for sentinel clot related to ectopic pregnancy.  In the setting of a negative pregnancy test, this appearance is likely related to a ruptured hemorrhagic cyst.  These results were called by telephone at the time of interpretation on 04/23/2014 at 2:56 am to [facility] , who verbally acknowledged these results.   Electronically Signed   By: Charline BillsSriyesh  Krishnan M.D.   On: 04/23/2014 02:57   [place] Pelvis Complete  04/23/2014   CLINICAL DATA:  Pelvic pain, evaluate for torsion  EXAM: TRANSABDOMINAL AND TRANSVAGINAL ULTRASOUND OF PELVIS  DOPPLER ULTRASOUND OF OVARIES  TECHNIQUE: Both transabdominal and transvaginal ultrasound examinations of the pelvis were performed. Transabdominal technique was performed for global imaging of the pelvis including uterus, ovaries, adnexal regions, and pelvic cul-de-sac.  It was necessary to proceed with endovaginal exam following the transabdominal exam to visualize the right ovary. Color and duplex Doppler ultrasound was utilized to evaluate blood flow to the ovaries.  COMPARISON:  None.  FINDINGS: Uterus  Measurements: 8.6 x 6.0 x 6.3 cm. Dominant 2.6 x 2.2 x 2.8 cm intramural fibroid in the right uterine fundus.  Endometrium  Thickness: 7 mm.  No focal abnormality visualized.   Right ovary  Measurements: 3.4 x 2.1 x 2.9 cm. 1.9 x 1.3 x 1.8 cm involuting corpus luteal cyst. 4.5 x 3.0 x 3.3 cm complex, heterogeneous lesion adjacent to the right ovary, with a  heteroechoic appearance which resembles clot (especially given the additional findings).  Left ovary  Measurements: 3.6 x 3.1 x 3.2 cm. Normal appearance/no adnexal mass.  Pulsed Doppler evaluation of both ovaries demonstrates normal low-resistance arterial and venous waveforms.  Other findings  Moderate complex pelvic fluid/hemorrhage.  IMPRESSION: 4.5 cm complex lesion adjacent to the right ovary, favored to reflect clot, with additional complex fluid/hemorrhage in the pelvic cul-de-sac.  In the setting of a positive pregnancy test, these findings would be highly suspicious for sentinel clot related to ectopic pregnancy.  In the setting of a negative pregnancy test, this appearance is likely related to a ruptured hemorrhagic cyst.  These results were called by telephone at the time of interpretation on 04/23/2014 at 2:56 am to [facility] , who verbally acknowledged these results.   Electronically Signed   By: Charline Bills M.D.   On: 04/23/2014 02:57   [place] Art/ven Flow Abd Pelv Doppler  04/23/2014   CLINICAL DATA:  Pelvic pain, evaluate for torsion  EXAM: TRANSABDOMINAL AND TRANSVAGINAL ULTRASOUND OF PELVIS  DOPPLER ULTRASOUND OF OVARIES  TECHNIQUE: Both transabdominal and transvaginal ultrasound examinations of the pelvis were performed. Transabdominal technique was performed for global imaging of the pelvis including uterus, ovaries, adnexal regions, and pelvic cul-de-sac.  It was necessary to proceed with endovaginal exam following the transabdominal exam to visualize the right ovary. Color and duplex Doppler ultrasound was utilized to evaluate blood flow to the ovaries.  COMPARISON:  None.  FINDINGS: Uterus  Measurements: 8.6 x 6.0 x 6.3 cm. Dominant 2.6 x 2.2 x 2.8 cm intramural fibroid in the right uterine fundus.   Endometrium  Thickness: 7 mm.  No focal abnormality visualized.  Right ovary  Measurements: 3.4 x 2.1 x 2.9 cm. 1.9 x 1.3 x 1.8 cm involuting corpus luteal cyst. 4.5 x 3.0 x 3.3 cm complex, heterogeneous lesion adjacent to the right ovary, with a heteroechoic appearance which resembles clot (especially given the additional findings).  Left ovary  Measurements: 3.6 x 3.1 x 3.2 cm. Normal appearance/no adnexal mass.  Pulsed Doppler evaluation of both ovaries demonstrates normal low-resistance arterial and venous waveforms.  Other findings  Moderate complex pelvic fluid/hemorrhage.  IMPRESSION: 4.5 cm complex lesion adjacent to the right ovary, favored to reflect clot, with additional complex fluid/hemorrhage in the pelvic cul-de-sac.  In the setting of a positive pregnancy test, these findings would be highly suspicious for sentinel clot related to ectopic pregnancy.  In the setting of a negative pregnancy test, this appearance is likely related to a ruptured hemorrhagic cyst.  These results were called by telephone at the time of interpretation on 04/23/2014 at 2:56 am to [facility] , who verbally acknowledged these results.   Electronically Signed   By: Charline Bills M.D.   On: 04/23/2014 02:57   Dg Abd Acute W/chest  04/23/2014   CLINICAL DATA:  Lower abdominal pain and nausea for 48 hr.  EXAM: ACUTE ABDOMEN SERIES (ABDOMEN 2 VIEW & CHEST 1 VIEW)  COMPARISON:  Chest 09/21/2010  FINDINGS: Normal heart size and pulmonary vascularity. No focal airspace disease or consolidation in the lungs. No blunting of costophrenic angles. No pneumothorax. Mediastinal contours appear intact.  Scattered gas and stool in the colon. No small or large bowel distention. No free intra-abdominal air. No abnormal air-fluid levels. No radiopaque stones. Visualized bones appear intact. Surgical clips in the pelvis.  IMPRESSION: No evidence of active pulmonary disease. Nonobstructive bowel gas pattern.   Electronically Signed    By:  Burman Nieves M.D.   On: 04/23/2014 03:00     Mora Bellman, PA-C 04/23/14 1610

## 2014-04-23 NOTE — ED Notes (Signed)
Pt was taken medications, pt states she wants to wait until results of US to know if it's def constipation

## 2014-04-24 NOTE — ED Provider Notes (Signed)
Medical screening examination/treatment/procedure(s) were performed by non-physician practitioner and as supervising physician I was immediately available for consultation/collaboration.   EKG Interpretation None        Caelan Atchley, MD 04/24/14 0141 

## 2014-04-24 NOTE — ED Provider Notes (Signed)
Medical screening examination/treatment/procedure(s) were performed by non-physician practitioner and as supervising physician I was immediately available for consultation/collaboration.   EKG Interpretation None        Cait Locust, MD 04/24/14 0141 

## 2014-10-07 ENCOUNTER — Other Ambulatory Visit: Payer: Self-pay | Admitting: Family Medicine

## 2014-10-07 DIAGNOSIS — N83202 Unspecified ovarian cyst, left side: Secondary | ICD-10-CM

## 2014-10-09 ENCOUNTER — Inpatient Hospital Stay: Admission: RE | Admit: 2014-10-09 | Payer: Self-pay | Source: Ambulatory Visit

## 2016-05-30 ENCOUNTER — Encounter (HOSPITAL_COMMUNITY): Payer: Self-pay | Admitting: Emergency Medicine

## 2016-05-30 ENCOUNTER — Emergency Department (HOSPITAL_COMMUNITY)
Admission: EM | Admit: 2016-05-30 | Discharge: 2016-05-31 | Disposition: A | Discharge status: Home / Self Care | Payer: 59 | Attending: Emergency Medicine | Admitting: Emergency Medicine

## 2016-05-30 DIAGNOSIS — J45909 Unspecified asthma, uncomplicated: Secondary | ICD-10-CM | POA: Diagnosis not present

## 2016-05-30 DIAGNOSIS — Z79899 Other long term (current) drug therapy: Secondary | ICD-10-CM | POA: Insufficient documentation

## 2016-05-30 DIAGNOSIS — R112 Nausea with vomiting, unspecified: Secondary | ICD-10-CM

## 2016-05-30 HISTORY — DX: Unspecified asthma, uncomplicated: J45.909

## 2016-05-30 MED ORDER — SODIUM CHLORIDE 0.9 % IV BOLUS (SEPSIS)
1000.0000 mL | Freq: Once | INTRAVENOUS | Status: AC
  Administered 2016-05-30: 1000 mL via INTRAVENOUS

## 2016-05-30 MED ORDER — ONDANSETRON HCL 4 MG/2ML IJ SOLN
4.0000 mg | Freq: Once | INTRAMUSCULAR | Status: AC
  Administered 2016-05-30: 4 mg via INTRAVENOUS
  Filled 2016-05-30: qty 2

## 2016-05-30 NOTE — ED Provider Notes (Signed)
[facility]-EMERGENCY DEPT Provider Note   CSN: [id number] Arrival date & time: 05/30/16  2252    By signing my name below, I, Madeline Kelly, attest that this documentation has been prepared under the direction and in the presence of Madeline Sites, PA-C Electronically Signed: Valentino Kelly, ED Scribe. 05/30/16. 11:30 PM.  History   Chief Complaint Chief Complaint  Patient presents with  . Emesis  . Dizziness   The history is provided by the patient and the spouse. No language interpreter was used.  Dizziness  Associated symptoms: nausea and vomiting     HPI Comments: Madeline Kelly is a 35 y.o. female who presents to the Emergency Department complaining of moderate, constant, episodal emesis and nausea onset this evening. Patient reports today she ate potato chips and leftovers from dinner last night. Patient spells insulin also ate the same leftovers, they are not experiencing any similar symptoms currently. She denies any abdominal pain, rather she just feels extremely nauseated. Emesis has been nonbloody and nonbilious to this point. Patient feels now she is "dry heaving". She is not had any fever or chills. States she does have some episodes of sweating during emesis.  Spouse reports similar symptoms over the summer, no known cause at that time. Patient did try some leftover nausea medication from prior visit, no relief. Prior abdominal surgeries include tubal ligation. No ongoing GI issues.  Past Medical History:  Diagnosis Date  . Asthma   . Migraines     Patient Active Problem List   Diagnosis Date Noted  . ASTHMA UNSPECIFIED WITH EXACERBATION 12/14/2008    Past Surgical History:  Procedure Laterality Date  . FOOT SURGERY    . TUBAL LIGATION      OB History    No data available       Home Medications    Prior to Admission medications   Medication Sig Start Date End Date Taking? Authorizing Provider  albuterol (PROVENTIL HFA;VENTOLIN HFA) 108 (90 BASE) MCG/ACT  inhaler Inhale 1-2 puffs into the lungs every 6 (six) hours as needed for wheezing or shortness of breath.    Historical Provider, MD  doxycycline (VIBRAMYCIN) 50 MG capsule Take 2 capsules (100 mg total) by mouth 2 (two) times daily. 04/23/14   Junious Silk, PA-C  metroNIDAZOLE (FLAGYL) 500 MG tablet Take 1 tablet (500 mg total) by mouth 2 (two) times daily. One po bid x 14 days 04/23/14   Junious Silk, PA-C  polyethylene glycol [facility] / GLYCOLAX) packet Take 17 g by mouth daily. 04/23/14   Hannah Muthersbaugh, PA-C  traMADol (ULTRAM) 50 MG tablet Take 1 tablet (50 mg total) by mouth every 6 (six) hours as needed. 04/23/14   Junious Silk, PA-C    Family History Family History  Problem Relation Age of Onset  . Cancer Other     Social History Social History  Substance Use Topics  . Smoking status: Never Smoker  . Smokeless tobacco: Never Used  . Alcohol use Yes     Allergies   Patient has no known allergies.   Review of Systems Review of Systems  Gastrointestinal: Positive for nausea and vomiting.  All other systems reviewed and are negative.    Physical Exam Updated Vital Signs BP 152/82 (BP Location: Right Arm)   Pulse 73   Temp 97.6 F (36.4 C) (Oral)   Resp 18   Ht 5\' 6"  (1.676 m)   Wt 160 lb (72.6 kg)   LMP 05/26/2016 (Approximate)   SpO2 100%   BMI  25.82 kg/m   Physical Exam  Constitutional: She is oriented to person, place, and time. She appears well-developed and well-nourished.  Spitting into emesis bag during exam, no active emesis  HENT:  Head: Normocephalic and atraumatic.  Mouth/Throat: Oropharynx is clear and moist.  Dry mucous membranes  Eyes: Conjunctivae and EOM are normal. Pupils are equal, round, and reactive to light.  Neck: Normal range of motion.  Cardiovascular: Normal rate, regular rhythm and normal heart sounds.   Pulmonary/Chest: Effort normal and breath sounds normal.  Abdominal: Soft. Bowel sounds are normal. There is no  tenderness. There is no rigidity and no guarding.  Abdomen soft, no focal tenderness or peritoneal signs, normal bowel sounds, no distention  Musculoskeletal: Normal range of motion.  Neurological: She is alert and oriented to person, place, and time.  Skin: Skin is warm and dry.  Psychiatric: She has a normal mood and affect.  Nursing note and vitals reviewed.    ED Treatments / Results   DIAGNOSTIC STUDIES: Oxygen Saturation is 100% on RA, normal by my interpretation.    COORDINATION OF CARE: 11:20 PM Discussed treatment plan with pt at bedside which includes labs, Zofran and pt agreed to plan.   Labs (all labs ordered are listed, but only abnormal results are displayed) Labs Reviewed  CBC WITH DIFFERENTIAL/PLATELET - Abnormal; Notable for the following:       Result Value   WBC 15.7 (*)    Neutro Abs 13.6 (*)    Monocytes Absolute 1.1 (*)    All other components within normal limits  COMPREHENSIVE METABOLIC PANEL - Abnormal; Notable for the following:    Glucose, Bld 172 (*)    All other components within normal limits  LIPASE, BLOOD  I-STAT BETA HCG BLOOD, ED ([facility], [facility], [facility] ONLY)    EKG  EKG Interpretation None       Radiology No results found.  Procedures Procedures (including critical care time)  Medications Ordered in ED Medications  sodium chloride 0.9 % bolus 1,000 mL (0 mLs Intravenous Stopped 05/31/16 0208)  ondansetron (ZOFRAN) injection 4 mg (4 mg Intravenous Given 05/30/16 2359)  metoCLOPramide (REGLAN) injection 10 mg (10 mg Intravenous Given 05/31/16 0040)     Initial Impression / Assessment and Plan / ED Course  I have reviewed the triage vital signs and the nursing notes.  Pertinent labs & imaging results that were available during my care of the patient were reviewed by me and considered in my medical decision making (see chart for details).  Clinical Course    35 year old female here with nausea and vomiting, onset this morning. Here she is  afebrile and nontoxic. She is spitting into emesis bag during exam, no active emesis. Her abdomen is soft and benign. Lab work obtained, does have a leukocytosis of 15.7K which may be reactive from her vomiting. Patient was treated here with IV fluids and antibiotics with good improvement of her symptoms. She is tolerating oral fluids without difficulty. She's not had any active emesis here in emergency department. Her abdomen remaind soft and benign, continues to deny pain. She feels comfortable with discharge home at this time. Discussed supportive care including gentle diet and progressing back to normal as tolerated. Rx Zofran.  Discussed plan with patient, she acknowledged understanding and agreed with plan of care.  Return precautions given for new or worsening symptoms.  Final Clinical Impressions(s) / ED Diagnoses   Final diagnoses:  Nausea and vomiting, intractability of vomiting not specified, unspecified vomiting type  New Prescriptions Discharge Medication List as of 05/31/2016  2:06 AM    START taking these medications   Details  ondansetron (ZOFRAN ODT) 4 MG disintegrating tablet Take 1 tablet (4 mg total) by mouth every 8 (eight) hours as needed for nausea or vomiting., Starting Wed 05/31/2016, Print        I personally performed the services described in this documentation, which was scribed in my presence. The recorded information has been reviewed and is accurate.    Garlon HatchetLisa M Omolola Mittman, PA-C 05/31/16 [id number]    Shon Batonourtney F Horton, MD 05/31/16 [phone]

## 2016-05-30 NOTE — ED Triage Notes (Signed)
Pt states she has been having nausea and vomiting for the past 3-4 hrs  No diarrhea  Denies abd pain  Pt states she feels weak and dizzy

## 2016-05-31 LAB — I-STAT BETA HCG BLOOD, ED (MC, WL, AP ONLY)

## 2016-05-31 LAB — CBC WITH DIFFERENTIAL/PLATELET
BASOS ABS: 0 10*3/uL (ref 0.0–0.1)
BASOS PCT: 0 %
EOS PCT: 0 %
Eosinophils Absolute: 0 10*3/uL (ref 0.0–0.7)
HCT: 39.9 % (ref 36.0–46.0)
Hemoglobin: 13.4 g/dL (ref 12.0–15.0)
Lymphocytes Relative: 7 %
Lymphs Abs: 1.1 10*3/uL (ref 0.7–4.0)
MCH: 27.2 pg (ref 26.0–34.0)
MCHC: 33.6 g/dL (ref 30.0–36.0)
MCV: 81.1 fL (ref 78.0–100.0)
MONO ABS: 1.1 10*3/uL — AB (ref 0.1–1.0)
MONOS PCT: 7 %
Neutro Abs: 13.6 10*3/uL — ABNORMAL HIGH (ref 1.7–7.7)
Neutrophils Relative %: 86 %
PLATELETS: 304 10*3/uL (ref 150–400)
RBC: 4.92 MIL/uL (ref 3.87–5.11)
RDW: 14.9 % (ref 11.5–15.5)
WBC: 15.7 10*3/uL — ABNORMAL HIGH (ref 4.0–10.5)

## 2016-05-31 LAB — COMPREHENSIVE METABOLIC PANEL
ALT: 26 U/L (ref 14–54)
ANION GAP: 8 (ref 5–15)
AST: 36 U/L (ref 15–41)
Albumin: 4 g/dL (ref 3.5–5.0)
Alkaline Phosphatase: 43 U/L (ref 38–126)
BUN: 12 mg/dL (ref 6–20)
CHLORIDE: 108 mmol/L (ref 101–111)
CO2: 22 mmol/L (ref 22–32)
Calcium: 9 mg/dL (ref 8.9–10.3)
Creatinine, Ser: 0.63 mg/dL (ref 0.44–1.00)
GFR calc Af Amer: >60 mL/min (ref >60)
GFR calc non Af Amer: >60 mL/min (ref >60)
Glucose, Bld: 172 mg/dL — ABNORMAL HIGH (ref 65–99)
POTASSIUM: 3.8 mmol/L (ref 3.5–5.1)
Sodium: 138 mmol/L (ref 135–145)
TOTAL PROTEIN: 7.2 g/dL (ref 6.5–8.1)
Total Bilirubin: 0.9 mg/dL (ref 0.3–1.2)

## 2016-05-31 LAB — LIPASE, BLOOD: LIPASE: 14 U/L (ref 11–51)

## 2016-05-31 MED ORDER — ONDANSETRON 4 MG PO TBDP: 4.0000 mg | ORAL_TABLET | Freq: Three times a day (TID) | ORAL | 0 refills | Status: AC | PRN

## 2016-05-31 MED ORDER — METOCLOPRAMIDE HCL 5 MG/ML IJ SOLN
10.0000 mg | Freq: Once | INTRAMUSCULAR | Status: AC
  Administered 2016-05-31: 10 mg via INTRAVENOUS
  Filled 2016-05-31: qty 2

## 2016-05-31 NOTE — ED Notes (Signed)
Pt given a gingerale

## 2016-05-31 NOTE — ED Notes (Signed)
Pt was able to tolerate PO fluids without emesis.  Pt also states that she feels ready to go home if she was given a prescription for anti-nausea medications.  PA made aware.

## 2016-05-31 NOTE — Discharge Instructions (Signed)
Take the prescribed medication as directed. °Recommend gentle diet for now and progress back to normal as tolerated. °Follow-up with your primary care doctor. °Return to the ED for new or worsening symptoms. °

## 2016-05-31 NOTE — ED Notes (Signed)
Pt seen ambulating in hall to restroom with a steady gait.  No assistance needed.

## 2016-08-28 ENCOUNTER — Encounter (HOSPITAL_COMMUNITY): Payer: Self-pay

## 2016-08-28 ENCOUNTER — Inpatient Hospital Stay (HOSPITAL_COMMUNITY): Payer: 59

## 2016-08-28 ENCOUNTER — Emergency Department (HOSPITAL_COMMUNITY): Payer: 59

## 2016-08-28 ENCOUNTER — Inpatient Hospital Stay (HOSPITAL_COMMUNITY)
Admission: EM | Admit: 2016-08-28 | Discharge: 2016-09-21 | DRG: 094 | Disposition: E | Payer: 59 | Attending: Pulmonary Disease | Admitting: Pulmonary Disease

## 2016-08-28 DIAGNOSIS — R4182 Altered mental status, unspecified: Secondary | ICD-10-CM | POA: Insufficient documentation

## 2016-08-28 DIAGNOSIS — Z01818 Encounter for other preprocedural examination: Secondary | ICD-10-CM

## 2016-08-28 DIAGNOSIS — Z9889 Other specified postprocedural states: Secondary | ICD-10-CM

## 2016-08-28 DIAGNOSIS — G009 Bacterial meningitis, unspecified: Secondary | ICD-10-CM | POA: Diagnosis present

## 2016-08-28 DIAGNOSIS — R29898 Other symptoms and signs involving the musculoskeletal system: Secondary | ICD-10-CM | POA: Diagnosis not present

## 2016-08-28 DIAGNOSIS — G935 Compression of brain: Secondary | ICD-10-CM | POA: Diagnosis present

## 2016-08-28 DIAGNOSIS — G9340 Encephalopathy, unspecified: Secondary | ICD-10-CM

## 2016-08-28 DIAGNOSIS — I610 Nontraumatic intracerebral hemorrhage in hemisphere, subcortical: Secondary | ICD-10-CM | POA: Diagnosis present

## 2016-08-28 DIAGNOSIS — R4020 Unspecified coma: Secondary | ICD-10-CM | POA: Diagnosis present

## 2016-08-28 DIAGNOSIS — Z9911 Dependence on respirator [ventilator] status: Secondary | ICD-10-CM | POA: Diagnosis not present

## 2016-08-28 DIAGNOSIS — G919 Hydrocephalus, unspecified: Secondary | ICD-10-CM | POA: Diagnosis present

## 2016-08-28 DIAGNOSIS — E876 Hypokalemia: Secondary | ICD-10-CM | POA: Diagnosis present

## 2016-08-28 DIAGNOSIS — R112 Nausea with vomiting, unspecified: Secondary | ICD-10-CM

## 2016-08-28 DIAGNOSIS — Z4659 Encounter for fitting and adjustment of other gastrointestinal appliance and device: Secondary | ICD-10-CM

## 2016-08-28 DIAGNOSIS — G92 Toxic encephalopathy: Secondary | ICD-10-CM | POA: Diagnosis present

## 2016-08-28 DIAGNOSIS — J9601 Acute respiratory failure with hypoxia: Secondary | ICD-10-CM | POA: Diagnosis present

## 2016-08-28 DIAGNOSIS — H7091 Unspecified mastoiditis, right ear: Secondary | ICD-10-CM | POA: Diagnosis present

## 2016-08-28 DIAGNOSIS — R7881 Bacteremia: Secondary | ICD-10-CM | POA: Diagnosis present

## 2016-08-28 DIAGNOSIS — G936 Cerebral edema: Secondary | ICD-10-CM | POA: Diagnosis present

## 2016-08-28 DIAGNOSIS — I609 Nontraumatic subarachnoid hemorrhage, unspecified: Secondary | ICD-10-CM | POA: Diagnosis present

## 2016-08-28 DIAGNOSIS — I10 Essential (primary) hypertension: Secondary | ICD-10-CM | POA: Diagnosis present

## 2016-08-28 DIAGNOSIS — H70001 Acute mastoiditis without complications, right ear: Secondary | ICD-10-CM

## 2016-08-28 DIAGNOSIS — R1310 Dysphagia, unspecified: Secondary | ICD-10-CM | POA: Diagnosis present

## 2016-08-28 DIAGNOSIS — G039 Meningitis, unspecified: Secondary | ICD-10-CM | POA: Diagnosis not present

## 2016-08-28 DIAGNOSIS — R000 Tachycardia, unspecified: Secondary | ICD-10-CM | POA: Diagnosis present

## 2016-08-28 DIAGNOSIS — B9689 Other specified bacterial agents as the cause of diseases classified elsewhere: Secondary | ICD-10-CM | POA: Diagnosis not present

## 2016-08-28 DIAGNOSIS — R42 Dizziness and giddiness: Secondary | ICD-10-CM | POA: Diagnosis not present

## 2016-08-28 DIAGNOSIS — I629 Nontraumatic intracranial hemorrhage, unspecified: Secondary | ICD-10-CM | POA: Diagnosis not present

## 2016-08-28 DIAGNOSIS — H7090 Unspecified mastoiditis, unspecified ear: Secondary | ICD-10-CM | POA: Diagnosis present

## 2016-08-28 DIAGNOSIS — J9600 Acute respiratory failure, unspecified whether with hypoxia or hypercapnia: Secondary | ICD-10-CM | POA: Diagnosis not present

## 2016-08-28 DIAGNOSIS — G001 Pneumococcal meningitis: Secondary | ICD-10-CM | POA: Diagnosis present

## 2016-08-28 DIAGNOSIS — R739 Hyperglycemia, unspecified: Secondary | ICD-10-CM | POA: Diagnosis present

## 2016-08-28 DIAGNOSIS — D649 Anemia, unspecified: Secondary | ICD-10-CM | POA: Diagnosis present

## 2016-08-28 DIAGNOSIS — Z79899 Other long term (current) drug therapy: Secondary | ICD-10-CM | POA: Diagnosis not present

## 2016-08-28 DIAGNOSIS — G9382 Brain death: Secondary | ICD-10-CM | POA: Diagnosis present

## 2016-08-28 DIAGNOSIS — J9621 Acute and chronic respiratory failure with hypoxia: Secondary | ICD-10-CM

## 2016-08-28 DIAGNOSIS — J0120 Acute ethmoidal sinusitis, unspecified: Secondary | ICD-10-CM

## 2016-08-28 DIAGNOSIS — Z9851 Tubal ligation status: Secondary | ICD-10-CM

## 2016-08-28 DIAGNOSIS — I615 Nontraumatic intracerebral hemorrhage, intraventricular: Secondary | ICD-10-CM | POA: Diagnosis present

## 2016-08-28 DIAGNOSIS — J322 Chronic ethmoidal sinusitis: Secondary | ICD-10-CM | POA: Diagnosis present

## 2016-08-28 DIAGNOSIS — J45909 Unspecified asthma, uncomplicated: Secondary | ICD-10-CM | POA: Diagnosis present

## 2016-08-28 DIAGNOSIS — I619 Nontraumatic intracerebral hemorrhage, unspecified: Secondary | ICD-10-CM

## 2016-08-28 DIAGNOSIS — I639 Cerebral infarction, unspecified: Secondary | ICD-10-CM

## 2016-08-28 DIAGNOSIS — G43909 Migraine, unspecified, not intractable, without status migrainosus: Secondary | ICD-10-CM | POA: Diagnosis present

## 2016-08-28 DIAGNOSIS — J329 Chronic sinusitis, unspecified: Secondary | ICD-10-CM | POA: Diagnosis present

## 2016-08-28 LAB — I-STAT TROPONIN, ED: Troponin i, poc: 0 ng/mL (ref 0.00–0.08)

## 2016-08-28 LAB — CBC WITH DIFFERENTIAL/PLATELET
Basophils Absolute: 0 10*3/uL (ref 0.0–0.1)
Basophils Relative: 0 %
EOS ABS: 0 10*3/uL (ref 0.0–0.7)
Eosinophils Relative: 0 %
HEMATOCRIT: 37.5 % (ref 36.0–46.0)
HEMOGLOBIN: 12.8 g/dL (ref 12.0–15.0)
LYMPHS ABS: 1.4 10*3/uL (ref 0.7–4.0)
LYMPHS PCT: 14 %
MCH: 26.4 pg (ref 26.0–34.0)
MCHC: 34.1 g/dL (ref 30.0–36.0)
MCV: 77.5 fL — AB (ref 78.0–100.0)
MONOS PCT: 7 %
Monocytes Absolute: 0.6 10*3/uL (ref 0.1–1.0)
NEUTROS PCT: 79 %
Neutro Abs: 7.6 10*3/uL (ref 1.7–7.7)
Platelets: 214 10*3/uL (ref 150–400)
RBC: 4.84 MIL/uL (ref 3.87–5.11)
RDW: 14.9 % (ref 11.5–15.5)
WBC: 9.6 10*3/uL (ref 4.0–10.5)

## 2016-08-28 LAB — CSF CELL COUNT WITH DIFFERENTIAL
Eosinophils, CSF: 0 % (ref 0–1)
Eosinophils, CSF: 0 % (ref 0–1)
Lymphs, CSF: 2 % — ABNORMAL LOW (ref 40–80)
Lymphs, CSF: 2 % — ABNORMAL LOW (ref 40–80)
Monocyte-Macrophage-Spinal Fluid: 1 % — ABNORMAL LOW (ref 15–45)
Monocyte-Macrophage-Spinal Fluid: 4 % — ABNORMAL LOW (ref 15–45)
Other Cells, CSF: 0
Other Cells, CSF: 0
RBC COUNT CSF: 145 /mm3 — AB
RBC COUNT CSF: 220 /mm3 — AB
SEGMENTED NEUTROPHILS-CSF: 94 % — AB (ref 0–6)
SEGMENTED NEUTROPHILS-CSF: 97 % — AB (ref 0–6)
TUBE #: 1
TUBE #: 4
WBC CSF: 1400 /mm3 — AB (ref 0–5)
WBC CSF: 1650 /mm3 — AB (ref 0–5)

## 2016-08-28 LAB — COMPREHENSIVE METABOLIC PANEL
ALK PHOS: 41 U/L (ref 38–126)
ALT: 16 U/L (ref 14–54)
ANION GAP: 8 (ref 5–15)
AST: 20 U/L (ref 15–41)
Albumin: 3.4 g/dL — ABNORMAL LOW (ref 3.5–5.0)
BILIRUBIN TOTAL: 0.6 mg/dL (ref 0.3–1.2)
BUN: 8 mg/dL (ref 6–20)
CALCIUM: 8.5 mg/dL — AB (ref 8.9–10.3)
CO2: 25 mmol/L (ref 22–32)
CREATININE: 0.73 mg/dL (ref 0.44–1.00)
Chloride: 99 mmol/L — ABNORMAL LOW (ref 101–111)
GFR calc Af Amer: >60 mL/min (ref >60)
Glucose, Bld: 183 mg/dL — ABNORMAL HIGH (ref 65–99)
Potassium: 3.3 mmol/L — ABNORMAL LOW (ref 3.5–5.1)
Sodium: 132 mmol/L — ABNORMAL LOW (ref 135–145)
TOTAL PROTEIN: 7.5 g/dL (ref 6.5–8.1)

## 2016-08-28 LAB — BLOOD CULTURE ID PANEL (REFLEXED)
ACINETOBACTER BAUMANNII: NOT DETECTED
CANDIDA ALBICANS: NOT DETECTED
CANDIDA GLABRATA: NOT DETECTED
CANDIDA KRUSEI: NOT DETECTED
CANDIDA TROPICALIS: NOT DETECTED
Candida parapsilosis: NOT DETECTED
ENTEROBACTER CLOACAE COMPLEX: NOT DETECTED
ENTEROBACTERIACEAE SPECIES: NOT DETECTED
ESCHERICHIA COLI: NOT DETECTED
Enterococcus species: NOT DETECTED
Haemophilus influenzae: NOT DETECTED
KLEBSIELLA OXYTOCA: NOT DETECTED
KLEBSIELLA PNEUMONIAE: NOT DETECTED
Listeria monocytogenes: NOT DETECTED
NEISSERIA MENINGITIDIS: NOT DETECTED
PROTEUS SPECIES: NOT DETECTED
Pseudomonas aeruginosa: NOT DETECTED
STAPHYLOCOCCUS SPECIES: NOT DETECTED
STREPTOCOCCUS AGALACTIAE: NOT DETECTED
Serratia marcescens: NOT DETECTED
Staphylococcus aureus (BCID): NOT DETECTED
Streptococcus pneumoniae: DETECTED — AB
Streptococcus pyogenes: NOT DETECTED
Streptococcus species: DETECTED — AB

## 2016-08-28 LAB — BLOOD GAS, ARTERIAL
ACID-BASE DEFICIT: 3.1 mmol/L — AB (ref 0.0–2.0)
Bicarbonate: 20.3 mmol/L (ref 20.0–28.0)
DRAWN BY: 295031
FIO2: 100
MECHVT: 480 mL
O2 Saturation: 99.6 %
PEEP/CPAP: 5 cmH2O
PH ART: 7.413 (ref 7.350–7.450)
PO2 ART: 548 mmHg — AB (ref 83.0–108.0)
Patient temperature: 98.6
RATE: 14 resp/min
pCO2 arterial: 32.4 mmHg (ref 32.0–48.0)

## 2016-08-28 LAB — PROTEIN AND GLUCOSE, CSF: GLUCOSE CSF: 47 mg/dL (ref 40–70)

## 2016-08-28 LAB — RAPID URINE DRUG SCREEN, HOSP PERFORMED
Amphetamines: NOT DETECTED
Barbiturates: NOT DETECTED
Benzodiazepines: NOT DETECTED
Cocaine: NOT DETECTED
Opiates: NOT DETECTED
Tetrahydrocannabinol: POSITIVE — AB

## 2016-08-28 LAB — I-STAT CG4 LACTIC ACID, ED: LACTIC ACID, VENOUS: 2.36 mmol/L — AB (ref 0.5–1.9)

## 2016-08-28 LAB — GLUCOSE, CAPILLARY
GLUCOSE-CAPILLARY: 167 mg/dL — AB (ref 65–99)
GLUCOSE-CAPILLARY: 123 mg/dL — AB (ref 65–99)
Glucose-Capillary: 139 mg/dL — ABNORMAL HIGH (ref 65–99)
Glucose-Capillary: 139 mg/dL — ABNORMAL HIGH (ref 65–99)

## 2016-08-28 LAB — PATHOLOGIST SMEAR REVIEW

## 2016-08-28 LAB — PROCALCITONIN: PROCALCITONIN: 0.21 ng/mL

## 2016-08-28 LAB — SALICYLATE LEVEL

## 2016-08-28 LAB — CBG MONITORING, ED: GLUCOSE-CAPILLARY: 188 mg/dL — AB (ref 65–99)

## 2016-08-28 LAB — I-STAT BETA HCG BLOOD, ED (MC, WL, AP ONLY)

## 2016-08-28 LAB — APTT: APTT: 32 s (ref 24–36)

## 2016-08-28 LAB — TRIGLYCERIDES: TRIGLYCERIDES: 69 mg/dL (ref <150)

## 2016-08-28 LAB — MRSA PCR SCREENING: MRSA BY PCR: NEGATIVE

## 2016-08-28 LAB — PROTIME-INR
INR: 1.27
Prothrombin Time: 16 seconds — ABNORMAL HIGH (ref 11.4–15.2)

## 2016-08-28 LAB — ETHANOL

## 2016-08-28 LAB — INFLUENZA PANEL BY PCR (TYPE A & B)
INFLAPCR: NEGATIVE
Influenza B By PCR: NEGATIVE

## 2016-08-28 LAB — LACTIC ACID, PLASMA
LACTIC ACID, VENOUS: 3.5 mmol/L — AB (ref 0.5–1.9)
LACTIC ACID, VENOUS: 3.1 mmol/L — AB (ref 0.5–1.9)

## 2016-08-28 LAB — ACETAMINOPHEN LEVEL: ACETAMINOPHEN (TYLENOL), SERUM: 11 ug/mL (ref 10–30)

## 2016-08-28 LAB — LIPASE, BLOOD: Lipase: 15 U/L (ref 11–51)

## 2016-08-28 MED ORDER — SODIUM CHLORIDE 0.9 % IV BOLUS (SEPSIS)
1000.0000 mL | Freq: Once | INTRAVENOUS | Status: AC
  Administered 2016-08-28: 1000 mL via INTRAVENOUS

## 2016-08-28 MED ORDER — ACYCLOVIR SODIUM 50 MG/ML IV SOLN
10.0000 mg/kg | Freq: Once | INTRAVENOUS | Status: AC
  Administered 2016-08-28: 725 mg via INTRAVENOUS
  Filled 2016-08-28: qty 14.5

## 2016-08-28 MED ORDER — SODIUM CHLORIDE 0.9 % IV SOLN
30.0000 meq | Freq: Once | INTRAVENOUS | Status: AC
  Administered 2016-08-28: 30 meq via INTRAVENOUS
  Filled 2016-08-28: qty 15

## 2016-08-28 MED ORDER — ACETAMINOPHEN 650 MG RE SUPP
650.0000 mg | Freq: Once | RECTAL | Status: AC
  Administered 2016-08-28: 650 mg via RECTAL
  Filled 2016-08-28: qty 1

## 2016-08-28 MED ORDER — NALOXONE HCL 0.4 MG/ML IJ SOLN: 0.4000 mg | Freq: Once | INTRAMUSCULAR | Status: DC

## 2016-08-28 MED ORDER — FAMOTIDINE IN NACL 20-0.9 MG/50ML-% IV SOLN
20.0000 mg | Freq: Two times a day (BID) | INTRAVENOUS | Status: DC
  Administered 2016-08-28 (×2): 20 mg via INTRAVENOUS
  Filled 2016-08-28 (×2): qty 50

## 2016-08-28 MED ORDER — DEXTROSE 5 % IV SOLN
2.0000 g | Freq: Once | INTRAVENOUS | Status: AC
  Administered 2016-08-28: 2 g via INTRAVENOUS
  Filled 2016-08-28: qty 2

## 2016-08-28 MED ORDER — VANCOMYCIN HCL IN DEXTROSE 1-5 GM/200ML-% IV SOLN
1000.0000 mg | Freq: Once | INTRAVENOUS | Status: AC
  Administered 2016-08-28: 1000 mg via INTRAVENOUS
  Filled 2016-08-28: qty 200

## 2016-08-28 MED ORDER — ORAL CARE MOUTH RINSE
15.0000 mL | Freq: Four times a day (QID) | OROMUCOSAL | Status: DC
  Administered 2016-08-29 (×3): 15 mL via OROMUCOSAL

## 2016-08-28 MED ORDER — DEXAMETHASONE SODIUM PHOSPHATE 10 MG/ML IJ SOLN
10.0000 mg | Freq: Once | INTRAMUSCULAR | Status: AC
  Administered 2016-08-28: 10 mg via INTRAVENOUS
  Filled 2016-08-28: qty 1

## 2016-08-28 MED ORDER — VANCOMYCIN HCL IN DEXTROSE 1-5 GM/200ML-% IV SOLN
1000.0000 mg | Freq: Three times a day (TID) | INTRAVENOUS | Status: DC
  Administered 2016-08-28 – 2016-08-30 (×6): 1000 mg via INTRAVENOUS
  Filled 2016-08-28 (×6): qty 200

## 2016-08-28 MED ORDER — INSULIN ASPART 100 UNIT/ML ~~LOC~~ SOLN
0.0000 [IU] | SUBCUTANEOUS | Status: DC
  Administered 2016-08-28 – 2016-08-29 (×4): 2 [IU] via SUBCUTANEOUS
  Administered 2016-08-29 (×2): 3 [IU] via SUBCUTANEOUS

## 2016-08-28 MED ORDER — ACETAMINOPHEN 160 MG/5ML PO SOLN
650.0000 mg | ORAL | Status: DC | PRN
  Administered 2016-08-28: 650 mg
  Filled 2016-08-28 (×2): qty 20.3

## 2016-08-28 MED ORDER — SODIUM CHLORIDE 0.9 % IV SOLN
INTRAVENOUS | Status: DC
  Administered 2016-08-28 – 2016-08-29 (×4): via INTRAVENOUS

## 2016-08-28 MED ORDER — FENTANYL CITRATE (PF) 100 MCG/2ML IJ SOLN: 100.0000 ug | INTRAMUSCULAR | Status: DC | PRN

## 2016-08-28 MED ORDER — FENTANYL CITRATE (PF) 100 MCG/2ML IJ SOLN
100.0000 ug | INTRAMUSCULAR | Status: DC | PRN
  Administered 2016-08-28 (×3): 100 ug via INTRAVENOUS

## 2016-08-28 MED ORDER — SODIUM CHLORIDE 0.9 % IV SOLN
25.0000 ug/h | INTRAVENOUS | Status: DC
  Administered 2016-08-28: 50 ug/h via INTRAVENOUS
  Administered 2016-08-29: 100 ug/h via INTRAVENOUS
  Filled 2016-08-28 (×2): qty 50

## 2016-08-28 MED ORDER — PROPOFOL 1000 MG/100ML IV EMUL
5.0000 ug/kg/min | Freq: Once | INTRAVENOUS | Status: AC
  Administered 2016-08-28: 5 ug/kg/min via INTRAVENOUS
  Filled 2016-08-28: qty 100

## 2016-08-28 MED ORDER — DEXTROSE 5 % IV SOLN
2.0000 g | Freq: Two times a day (BID) | INTRAVENOUS | Status: DC
  Administered 2016-08-28 – 2016-09-01 (×9): 2 g via INTRAVENOUS
  Filled 2016-08-28 (×11): qty 2

## 2016-08-28 MED ORDER — SODIUM CHLORIDE 0.9 % IV BOLUS (SEPSIS)
500.0000 mL | Freq: Once | INTRAVENOUS | Status: AC
  Administered 2016-08-28: 500 mL via INTRAVENOUS

## 2016-08-28 MED ORDER — SODIUM CHLORIDE 0.9 % IV SOLN
250.0000 mL | INTRAVENOUS | Status: DC | PRN
  Administered 2016-08-31: 250 mL via INTRAVENOUS

## 2016-08-28 MED ORDER — DEXAMETHASONE SODIUM PHOSPHATE 4 MG/ML IJ SOLN
10.0000 mg | Freq: Four times a day (QID) | INTRAMUSCULAR | Status: DC
  Administered 2016-08-28 – 2016-08-31 (×13): 10 mg via INTRAVENOUS
  Filled 2016-08-28 (×15): qty 3

## 2016-08-28 MED ORDER — HEPARIN SODIUM (PORCINE) 5000 UNIT/ML IJ SOLN
5000.0000 [IU] | Freq: Three times a day (TID) | INTRAMUSCULAR | Status: DC
  Administered 2016-08-28 – 2016-08-31 (×8): 5000 [IU] via SUBCUTANEOUS
  Filled 2016-08-28 (×9): qty 1

## 2016-08-28 MED ORDER — PROPOFOL 1000 MG/100ML IV EMUL
0.0000 ug/kg/min | INTRAVENOUS | Status: DC
  Administered 2016-08-28 (×2): 30 ug/kg/min via INTRAVENOUS
  Administered 2016-08-28: 40 ug/kg/min via INTRAVENOUS
  Administered 2016-08-29: 30 ug/kg/min via INTRAVENOUS
  Filled 2016-08-28 (×4): qty 100

## 2016-08-28 MED ORDER — CHLORHEXIDINE GLUCONATE 0.12% ORAL RINSE (MEDLINE KIT)
15.0000 mL | Freq: Two times a day (BID) | OROMUCOSAL | Status: DC
  Administered 2016-08-29 (×2): 15 mL via OROMUCOSAL

## 2016-08-28 NOTE — ED Notes (Signed)
RN will collect labs. 

## 2016-08-28 NOTE — ED Notes (Signed)
First set of blood cultures obtained at 0353

## 2016-08-28 NOTE — ED Notes (Signed)
Second set of blood cultures obtained 

## 2016-08-28 NOTE — ED Provider Notes (Signed)
[facility]-EMERGENCY DEPT Provider Note   CSN: [id number] Arrival date & time: 08-29-16  4098   By signing my name below, I, Clarisse Gouge, attest that this documentation has been prepared under the direction and in the presence of Alvira Monday, MD. Electronically signed, Clarisse Gouge, ED Scribe. 08-29-16. 9:04 AM .   History   Chief Complaint Chief Complaint  Patient presents with  . Altered Mental Status  LEVEL  CAVEAT DUE TO ALTERED MENTAL STATUS   The history is provided by the spouse and medical records. The history is limited by the condition of the patient. No language interpreter was used.    HPI Comments: Madeline Kelly is a 36 y.o. female who presents to the Emergency Department complaining of sudden onset severe headache since 10:36 and sudden onset altered mental status x ~1 hours. Spouse notes pt was unable to sleep tonight d/t her headache this evening, and the pt woke up suddenly ~1 hour prior to evaluation unresponsive with altered mental status and vomiting PTA. He further notes the pt has had a subjective tactile fever x 2-3 days at night. He adds the pt had to be carried to the car for transport to [facility] ED today and she has been making inappropriate speech since she awakened. Spouse notes right foot and left hip pain x "a few days" that the pt was seen for at a [facility] urgent care on 08/27/2016 and given a prescription for prednisone. He adds the pt took 1 dose of prednisone from a prior prescription and she was not able to fill her Rx. He states the pt took 2 tylenol at home without relief. He states the pt drinks alcohol occasionally but does not smoke tobacco or use illicit drugs. Spouse denies Hx of similar episodes in the past. No noted focal weakness on one side, however she appeared weak all over and confused.   Past Medical History:  Diagnosis Date  . Asthma   . Migraines     Patient Active Problem List   Diagnosis Date Noted  . Altered mental status 08-29-16  .  Meningitis 08/29/16  . ASTHMA UNSPECIFIED WITH EXACERBATION 12/14/2008    Past Surgical History:  Procedure Laterality Date  . FOOT SURGERY    . TUBAL LIGATION      OB History    No data available       Home Medications    Prior to Admission medications   Medication Sig Start Date End Date Taking? Authorizing Provider  acetaminophen (TYLENOL) 500 MG tablet Take 1,000 mg by mouth every 6 (six) hours as needed.   Yes Historical Provider, MD  albuterol (PROVENTIL HFA;VENTOLIN HFA) 108 (90 BASE) MCG/ACT inhaler Inhale 1-2 puffs into the lungs every 6 (six) hours as needed for wheezing or shortness of breath.   Yes Historical Provider, MD  ibuprofen (ADVIL,MOTRIN) 200 MG tablet Take 400-600 mg by mouth every 6 (six) hours as needed for headache.   Yes Historical Provider, MD  predniSONE (STERAPRED UNI-PAK 21 TAB) 10 MG (21) TBPK tablet  08/27/16  Yes Historical Provider, MD  doxycycline (VIBRAMYCIN) 50 MG capsule Take 2 capsules (100 mg total) by mouth 2 (two) times daily. Patient not taking: Reported on 05/31/2016 04/23/14   Junious Silk, PA-C  metroNIDAZOLE (FLAGYL) 500 MG tablet Take 1 tablet (500 mg total) by mouth 2 (two) times daily. One po bid x 14 days Patient not taking: Reported on 05/31/2016 04/23/14   Junious Silk, PA-C  ondansetron (ZOFRAN ODT) 4 MG disintegrating  tablet Take 1 tablet (4 mg total) by mouth every 8 (eight) hours as needed for nausea or vomiting. Patient not taking: Reported on 09-23-2016 05/31/16   Garlon Hatchet, PA-C  polyethylene glycol [facility] Ethelene Hal) packet Take 17 g by mouth daily. Patient not taking: Reported on 05/31/2016 04/23/14   Dahlia Client Muthersbaugh, PA-C  traMADol (ULTRAM) 50 MG tablet Take 1 tablet (50 mg total) by mouth every 6 (six) hours as needed. Patient not taking: Reported on 05/31/2016 04/23/14   Junious Silk, PA-C    Family History Family History  Problem Relation Age of Onset  . Cancer Other     Social History Social History    Substance Use Topics  . Smoking status: Never Smoker  . Smokeless tobacco: Never Used  . Alcohol use Yes     Allergies   Patient has no known allergies.   Review of Systems Review of Systems  Unable to perform ROS: Mental status change  Constitutional: Negative for fever.  HENT: Positive for congestion.   Gastrointestinal: Positive for nausea and vomiting.  Musculoskeletal: Positive for arthralgias.  Neurological: Positive for headaches.  Psychiatric/Behavioral: Positive for confusion.     Physical Exam Updated Vital Signs BP 122/81   Pulse 83   Temp 99.1 F (37.3 C)   Resp 21   Ht 5\' 6"  (1.676 m)   Wt 160 lb (72.6 kg)   SpO2 100%   BMI 25.82 kg/m   Physical Exam  Constitutional: She appears well-developed and well-nourished. She appears toxic. She has a sickly appearance. No distress.  Confused  HENT:  Head: Normocephalic and atraumatic.  Eyes: Conjunctivae and EOM are normal.  Neck: Normal range of motion. Neck rigidity present. Brudzinski's sign noted. No Kernig's sign noted.  Cardiovascular: Normal rate, regular rhythm, normal heart sounds and intact distal pulses.  Exam reveals no gallop and no friction rub.   No murmur heard. Pulmonary/Chest: Effort normal and breath sounds normal. No respiratory distress. She has no wheezes. She has no rales.  Abdominal: Soft. She exhibits no distension. There is no tenderness. There is no guarding.  Musculoskeletal: She exhibits no edema or tenderness.  Neurological: GCS eye subscore is 3. GCS verbal subscore is 4. GCS motor subscore is 6.  Initial presentation, intermittently follows commands, follows commands with symmetric strength UE, no pronator drift. Will not lift lower extremities on command but withdraws both to pain symmetric strength. Symmetric facies, tongue, uvula.  Answers name but appears sleepy, constantly turning on side, not answering questions.  When convinced to lay on back for blood draw, says "i'm  sorry", but is altered, agitated  Skin: Skin is warm and dry. No rash noted. She is not diaphoretic. No erythema.  Nursing note and vitals reviewed.    ED Treatments / Results  DIAGNOSTIC STUDIES:  COORDINATION OF CARE: 9:04 AM Discussed treatment plan with pt at bedside and family agreed to plan.  Labs (all labs ordered are listed, but only abnormal results are displayed) Labs Reviewed  CBC WITH DIFFERENTIAL/PLATELET - Abnormal; Notable for the following:       Result Value   MCV 77.5 (*)    All other components within normal limits  COMPREHENSIVE METABOLIC PANEL - Abnormal; Notable for the following:    Sodium 132 (*)    Potassium 3.3 (*)    Chloride 99 (*)    Glucose, Bld 183 (*)    Calcium 8.5 (*)    Albumin 3.4 (*)    All other components within  normal limits  RAPID URINE DRUG SCREEN, HOSP PERFORMED - Abnormal; Notable for the following:    Tetrahydrocannabinol POSITIVE (*)    All other components within normal limits  CSF CELL COUNT WITH DIFFERENTIAL - Abnormal; Notable for the following:    Appearance, CSF CLOUDY (*)    RBC Count, CSF 220 (*)    WBC, CSF 1,650 (*)    Segmented Neutrophils-CSF 97 (*)    Lymphs, CSF 2 (*)    Monocyte-Macrophage-Spinal Fluid 1 (*)    All other components within normal limits  CSF CELL COUNT WITH DIFFERENTIAL - Abnormal; Notable for the following:    Appearance, CSF CLOUDY (*)    RBC Count, CSF 145 (*)    WBC, CSF 1,400 (*)    Segmented Neutrophils-CSF 94 (*)    Lymphs, CSF 2 (*)    Monocyte-Macrophage-Spinal Fluid 4 (*)    All other components within normal limits  PROTEIN AND GLUCOSE, CSF - Abnormal; Notable for the following:    Total  Protein, CSF >600 (*)    All other components within normal limits  BLOOD GAS, ARTERIAL - Abnormal; Notable for the following:    pO2, Arterial 548 (*)    Acid-base deficit 3.1 (*)    All other components within normal limits  CBG MONITORING, ED - Abnormal; Notable for the following:     Glucose-Capillary 188 (*)    All other components within normal limits  I-STAT CG4 LACTIC ACID, ED - Abnormal; Notable for the following:    Lactic Acid, Venous 2.36 (*)    All other components within normal limits  CSF CULTURE  CULTURE, BLOOD (ROUTINE X 2)  CULTURE, BLOOD (ROUTINE X 2)  ETHANOL  LIPASE, BLOOD  SALICYLATE LEVEL  ACETAMINOPHEN LEVEL  INFLUENZA PANEL BY PCR (TYPE A & B)  PATHOLOGIST SMEAR REVIEW  PATHOLOGIST SMEAR REVIEW  HERPES SIMPLEX VIRUS(HSV) DNA BY PCR  PROCALCITONIN  CBC  CREATININE, SERUM  LACTIC ACID, PLASMA  LACTIC ACID, PLASMA  PROTIME-INR  APTT  TRIGLYCERIDES  I-STAT TROPOININ, ED  I-STAT BETA HCG BLOOD, ED ([facility], [facility], [facility] ONLY)  I-STAT CG4 LACTIC ACID, ED    EKG  EKG Interpretation  Date/Time:  Monday Sep 25, 2016 02:57:55 EST Ventricular Rate:  105 PR Interval:    QRS Duration: 106 QT Interval:  326 QTC Calculation: 431 R Axis:   77 Text Interpretation:  Sinus tachycardia RSR' in V1 or V2, right VCD or RVH Probable left ventricular hypertrophy Since prior ECG  rate has increased Confirmed by [facility] MD, Tonnia Bardin ([id number]) on Sep 25, 2016 3:53:25 AM       Radiology Ct Head Wo Contrast  Result Date: September 25, 2016 CLINICAL DATA:  Altered mental status. Headaches started around 2200 hours on 08/27/2016. Increasing pain. Nausea and vomiting. No injury. EXAM: CT HEAD WITHOUT CONTRAST TECHNIQUE: Contiguous axial images were obtained from the base of the skull through the vertex without intravenous contrast. COMPARISON:  None. FINDINGS: Brain: No evidence of acute infarction, hemorrhage, hydrocephalus, extra-axial collection or mass lesion/mass effect. Vascular: No hyperdense vessel or unexpected calcification. Skull: Normal. Negative for fracture or focal lesion. Sinuses/Orbits: Mucosal thickening in the paranasal sinuses with opacification of multiple left ethmoid air cells. Diffuse opacification of the right mastoid air cells. Left mastoid air cells are  clear. Other: None. IMPRESSION: No acute intracranial abnormalities. Right mastoid effusions. Inflammatory changes in the paranasal sinuses. Electronically Signed   By: Burman Nieves M.D.   On: 2016-09-25 03:55   Dg Chest Portable 1 View  Result Date: 09/18/2016 CLINICAL DATA:  OG tube placement. EXAM: PORTABLE CHEST 1 VIEW 6:54 a.m. COMPARISON:  31-Dec-2016 at 5:10 a.m. FINDINGS: Endotracheal tube is at the carina and should be retracted approximately 2 cm. OG tube tip is below the diaphragm. Heart size and pulmonary vascularity are normal and the lungs are clear. IMPRESSION: Endotracheal tube at the carina. OG tube in good position. Clear lungs. Electronically Signed   By: Francene BoyersJames  Maxwell M.D.   On: 31-Dec-2016 07:11   Dg Chest Portable 1 View  Result Date: 09/06/2016 CLINICAL DATA:  Headache and vomiting. Combative. Altered mental status. EXAM: PORTABLE CHEST 1 VIEW COMPARISON:  Chest radiograph April 23, 2014 FINDINGS: Cardiomediastinal silhouette is normal. Prominent bronchovascular markings. No pleural effusions or focal consolidations. Trachea projects midline and there is no pneumothorax. Soft tissue planes and included osseous structures are non-suspicious. IMPRESSION: Prominent bronchovascular markings suggesting atypical infection or pulmonary edema. Electronically Signed   By: Awilda Metroourtnay  Bloomer M.D.   On: 31-Dec-2016 05:31    Procedures .Lumbar Puncture Date/Time: 09/11/2016 8:53 AM Performed by: Alvira MondaySCHLOSSMAN, Tyrin Herbers Authorized by: Alvira MondaySCHLOSSMAN, Nicholas Ossa   Consent:    Consent obtained:  Written   Consent given by:  Spouse   Risks discussed:  Bleeding, infection and pain   Alternatives discussed:  Observation Pre-procedure details:    Procedure purpose:  Diagnostic   Preparation: Patient was prepped and draped in usual sterile fashion   Procedure details:    Number of attempts:  5 or more   Fluid appearance:  Cloudy   Tubes of fluid:  4   Total volume (ml):  7 Post-procedure:    Puncture  site:  Adhesive bandage applied   Patient tolerance of procedure:  Tolerated well, no immediate complications  Procedure Name: Intubation Date/Time: 09/04/2016 8:56 AM Performed by: Alvira MondaySCHLOSSMAN, Kaitlan Bin Pre-anesthesia Checklist: Patient identified, Emergency Drugs available, Timeout performed and Patient being monitored Oxygen Delivery Method: Non-rebreather mask Preoxygenation: Pre-oxygenation with 100% oxygen Intubation Type: IV induction and Rapid sequence Ventilation: Mask ventilation without difficulty Laryngoscope Size: Glidescope Grade View: Grade I Tube size: 7.0 mm Number of attempts: 1 Placement Confirmation: ETT inserted through vocal cords under direct vision Secured at: 24 cm Comments: Tube withdrawn 2.5cm after XR shows it at carina      (including critical care time)  Medications Ordered in ED Medications  vancomycin (VANCOCIN) IVPB 1000 mg/200 mL premix (not administered)  0.9 %  sodium chloride infusion (not administered)  insulin aspart (novoLOG) injection 0-15 Units (not administered)  heparin injection 5,000 Units (not administered)  famotidine (PEPCID) IVPB 20 mg premix (not administered)  0.9 %  sodium chloride infusion (not administered)  fentaNYL (SUBLIMAZE) injection 100 mcg (not administered)  fentaNYL (SUBLIMAZE) injection 100 mcg (not administered)  propofol (DIPRIVAN) 1000 MG/100ML infusion (20 mcg/kg/min  72.6 kg Intravenous Rate/Dose Verify 09/04/2016 0900)  cefTRIAXone (ROCEPHIN) 2 g in dextrose 5 % 50 mL IVPB (not administered)  dexamethasone (DECADRON) injection 10 mg (not administered)  sodium chloride 0.9 % bolus 1,000 mL (0 mLs Intravenous Stopped 08/26/2016 0534)  ceFEPIme (MAXIPIME) 2 g in dextrose 5 % 50 mL IVPB (0 g Intravenous Stopped 09/20/2016 0444)  dexamethasone (DECADRON) injection 10 mg (10 mg Intravenous Given 09/03/2016 0448)  sodium chloride 0.9 % bolus 1,000 mL (0 mLs Intravenous Stopped 09/13/2016 0609)  vancomycin (VANCOCIN) IVPB 1000 mg/200 mL  premix (0 mg Intravenous Stopped 09/17/2016 0609)  acyclovir (ZOVIRAX) 725 mg in dextrose 5 % 150 mL IVPB (0 mg/kg  72.6 kg Intravenous Stopped 09/03/2016 0628)  acetaminophen (TYLENOL) suppository 650 mg (650 mg Rectal Given 09/08/2016 0532)  sodium chloride 0.9 % bolus 500 mL (0 mLs Intravenous Stopped 08/24/2016 0852)  propofol (DIPRIVAN) 1000 MG/100ML infusion (10 mcg/kg/min  72.6 kg Intravenous Rate/Dose Change 09/14/2016 0820)   CRITICAL CARE: bacterial meningitis. Altered mental status Performed by: Rhae Lerner   Total critical care time: 45 minutes  Critical care time was exclusive of separately billable procedures and treating other patients.  Critical care was necessary to treat or prevent imminent or life-threatening deterioration.  Critical care was time spent personally by me on the following activities: development of treatment plan with patient and/or surrogate as well as nursing, discussions with consultants, evaluation of patient's response to treatment, examination of patient, obtaining history from patient or surrogate, ordering and performing treatments and interventions, ordering and review of laboratory studies, ordering and review of radiographic studies, pulse oximetry and re-evaluation of patient's condition.   Initial Impression / Assessment and Plan / ED Course  I have reviewed the triage vital signs and the nursing notes.  Pertinent labs & imaging results that were available during my care of the patient were reviewed by me and considered in my medical decision making (see chart for details).      36 year old female presents with concern for headache and altered mental status. He should have been seen previously in the day for joint pain in the given prednisone, however 10 PM developed severe headache, with development of altered mental status, nausea and vomiting prior to arrival to ED.  Patient protecting her airway on my initial evaluation, intermittently  following commands, however confused and sleepy.  Initial ddx included meningitis, drug effects, ICH.  Patient was febrile to 102.6, and empiric anabiotic's were ordered for concern of meningitis. Patient was given vancomycin, cefepime and 10 mg of Decadron. CT head showed no acute intracranial bleed or abnormality. Lumbar puncture showed cloudy fluid which was positive for gram-positive cocci in pairs on Gram stain consistent with bacterial meningitis. Patient's mental status continued to decline, and she became more agitated, pulling out IVs, and prepared for intubation for concern of airway protection, course of care, and patient safety. Prior to patient receiving medications for intubation, she also had an apneic episode.  Patient is intubated, tube placement confirmed and tube was retracted 2 cm. ICU was consulted for admission. Family updated of care.   I personally performed the services described in this documentation, which was scribed in my presence. The recorded information has been reviewed and is accurate.   Final Clinical Impressions(s) / ED Diagnoses   Final diagnoses:  Bacterial meningitis    New Prescriptions New Prescriptions   No medications on file     Alvira Monday, MD 09/19/2016 [phone]

## 2016-08-28 NOTE — Progress Notes (Signed)
CRITICAL VALUE ALERT  Critical value received:  Lactic acid 3.5  Date of notification:  08/28/2013  Time of notification:  1058  Critical value read back:Yes.    Nurse who received alert:  Estill DoomsASHEENA Aniella Wandrey RN  MD notified (1st page):  CCM   Time of first page:  1100  MD notified (2nd page):  Time of second page:  Responding MD:  Gerda DissPETE B. CCM   Time MD responded:  1100

## 2016-08-28 NOTE — ED Notes (Signed)
[email or web address] Dr. Dalene SeltzerSchlossman at bedside to prepare for intubation. Respiratory at bedside, and equipment prepared.  [phone] time out for intubation 0626 patient ventilated BVM  0628 20 mg Etomidate IVP in R forearm, given and flushed  by Dillard CannonEmily B. RN [phone] 80 mg Rocuronium given and flushed by Dillard CannonEmily B. RN.  0630 size 7 ET tube inserted via glideoscope by Dr. Dalene SeltzerSchlossman.  [phone] color change noted on entitle co2 detector durring BVM ventilation. Equal chest rise and fall no air movement noted over epigastrum, by Dr. Dalene SeltzerSchlossman.  [id number] patient placed on ventilator by RT 0637 OG tube inserted and secured to left side of mouth, positive gastric content noted after insertion, attached to low intermittent suction , by Clementeen GrahamIlene C. RN.  [phone] Acylovir restarted.  [phone] X-ray at bedside to confirm placement.  [phone] family back at bedside and restraints removed due to sedation.

## 2016-08-28 NOTE — Progress Notes (Signed)
Pharmacy Antibiotic Note  Paulina FusiKathryn Linden is a 36 y.o. female admitted on 08/27/2016 with suspected meningitis.  Pharmacy has been consulted for Vancomycin dosing.  Plan: Vancomycin 1gm IV every 8 hours.  Goal trough 15-20 mcg/mL.  Height: 5\' 6"  (167.6 cm) Weight: 160 lb (72.6 kg) IBW/kg (Calculated) : 59.3  Temp (24hrs), Avg:102.6 F (39.2 C), Min:102.6 F (39.2 C), Max:102.6 F (39.2 C)   Recent Labs Lab 08/31/2016 0306 08/25/2016 0419  WBC 9.6  --   CREATININE 0.73  --   LATICACIDVEN  --  2.36*    Estimated Creatinine Clearance: 100.1 mL/min (by C-G formula based on SCr of 0.73 mg/dL).    No Known Allergies  Antimicrobials this admission: Vancomycin 09/18/2016 >> Cefepime 09/16/2016 x1 Acyclovir 2/5 x1  Dose adjustments this admission: -  Microbiology results: Pending  Thank you for allowing pharmacy to be a part of this patient's care.  Aleene DavidsonGrimsley Jr, Ilamae Geng Crowford 08/29/2016 6:36 AM

## 2016-08-28 NOTE — ED Notes (Signed)
CSF- TUBE 1 WBC 1650 TUBE 4- WBC 1400  Dr.Schloshman notified

## 2016-08-28 NOTE — Progress Notes (Signed)
PHARMACY - PHYSICIAN COMMUNICATION CRITICAL VALUE ALERT - BLOOD CULTURE IDENTIFICATION (BCID)  Results for orders placed or performed during the hospital encounter of 08-15-16  Blood Culture ID Panel (Reflexed) (Collected: 08/26/2016  4:03 AM)  Result Value Ref Range   Enterococcus species NOT DETECTED NOT DETECTED   Listeria monocytogenes NOT DETECTED NOT DETECTED   Staphylococcus species NOT DETECTED NOT DETECTED   Staphylococcus aureus NOT DETECTED NOT DETECTED   Streptococcus species DETECTED (A) NOT DETECTED   Streptococcus agalactiae NOT DETECTED NOT DETECTED   Streptococcus pneumoniae DETECTED (A) NOT DETECTED   Streptococcus pyogenes NOT DETECTED NOT DETECTED   Acinetobacter baumannii NOT DETECTED NOT DETECTED   Enterobacteriaceae species NOT DETECTED NOT DETECTED   Enterobacter cloacae complex NOT DETECTED NOT DETECTED   Escherichia coli NOT DETECTED NOT DETECTED   Klebsiella oxytoca NOT DETECTED NOT DETECTED   Klebsiella pneumoniae NOT DETECTED NOT DETECTED   Proteus species NOT DETECTED NOT DETECTED   Serratia marcescens NOT DETECTED NOT DETECTED   Haemophilus influenzae NOT DETECTED NOT DETECTED   Neisseria meningitidis NOT DETECTED NOT DETECTED   Pseudomonas aeruginosa NOT DETECTED NOT DETECTED   Candida albicans NOT DETECTED NOT DETECTED   Candida glabrata NOT DETECTED NOT DETECTED   Candida krusei NOT DETECTED NOT DETECTED   Candida parapsilosis NOT DETECTED NOT DETECTED   Candida tropicalis NOT DETECTED NOT DETECTED    Name of physician (or Provider) Contacted: Dr Orvan Falconerampbell  Changes to prescribed antibiotics required: Continue current antibiotics  Abbigale Mcelhaney, Joselyn GlassmanLeann Trefz, PharmD 09/09/2016  8:16 PM

## 2016-08-28 NOTE — ED Notes (Signed)
RN AND MD NOTIFIED OF PATIENTS LACTIC ACID OF 2.36

## 2016-08-28 NOTE — ED Notes (Signed)
Patient place in restraints due to patient safety regarding altered mental status that is not at patients baseline.

## 2016-08-28 NOTE — H&P (Signed)
PULMONARY / CRITICAL CARE MEDICINE   Name: Madeline Kelly MRN: [id number] DOB: 13-Apr-1981    ADMISSION DATE:  08/26/2016 CONSULTATION DATE:  2/5  REFERRING MD:  schlossman  CHIEF COMPLAINT:  meningitis   HISTORY OF PRESENT ILLNESS:   This is a 36 year old female who was in her usual state of health until 2/2 at which time she developed sinus congestion, HA, cough, associated CP and generalized discomfort. Of note her boss was sick ~ 2d prior. The following day she was still not feeling well, had right hip pain to the point it was difficult to walk. Still went to work. On 2/4 had left foot/ankle pain. For this [facility]. Was given prednisone and instructed to get mucinex for what was thought to be common cold. The pm hours of 2/4 she developed head ache and asked husband to get tylenol. The head ache continued to worsen in spite apap. She then became confused and speech was garbled at times. She was so weak husband had to carrie her to the ER. In ER an LP was obtained that demonstrated GPCs, her initial lactic acid was 2.36. She was started on empiric STAT antibiotics, intubated for airway protection and administered 30 ml/kg IVF bolus per sepsis protocol.  On post-resuscitation Repeat Assessment Performed at: 0800 Vitals Blood pressure 122/81, pulse 83, temperature 99.1 F (37.3 C), resp. rate 21, height 5\' 6"  (1.676 m), weight 160 lb (72.6 kg), SpO2 100 %. Heart:Regular rate and rhythm  Lungs   CTACapillary Refill:   <2 sec Peripheral Pulse: Radial pulse palpableSkin:  Normal Color  She will be admitted to the ICU  PAST MEDICAL HISTORY :  She  has a past medical history of Asthma and Migraines.  PAST SURGICAL HISTORY: She  has a past surgical history that includes Tubal ligation and Foot surgery.  No Known Allergies  No current facility-administered medications on file prior to encounter.    Current Outpatient Prescriptions on File Prior to Encounter  Medication  Sig  . albuterol (PROVENTIL HFA;VENTOLIN HFA) 108 (90 BASE) MCG/ACT inhaler Inhale 1-2 puffs into the lungs every 6 (six) hours as needed for wheezing or shortness of breath.  . doxycycline (VIBRAMYCIN) 50 MG capsule Take 2 capsules (100 mg total) by mouth 2 (two) times daily. (Patient not taking: Reported on 05/31/2016)  . metroNIDAZOLE (FLAGYL) 500 MG tablet Take 1 tablet (500 mg total) by mouth 2 (two) times daily. One po bid x 14 days (Patient not taking: Reported on 05/31/2016)  . ondansetron (ZOFRAN ODT) 4 MG disintegrating tablet Take 1 tablet (4 mg total) by mouth every 8 (eight) hours as needed for nausea or vomiting. (Patient not taking: Reported on 08/31/2016)  . polyethylene glycol (MIRALAX / GLYCOLAX) packet Take 17 g by mouth daily. (Patient not taking: Reported on 05/31/2016)  . traMADol (ULTRAM) 50 MG tablet Take 1 tablet (50 mg total) by mouth every 6 (six) hours as needed. (Patient not taking: Reported on 05/31/2016)    FAMILY HISTORY:  Her indicated that the status of her other is unknown.    SOCIAL HISTORY: She  reports that she has never smoked. She has never used smokeless tobacco. She reports that she drinks alcohol. She reports that she does not use drugs.  REVIEW OF SYSTEMS:   Not able   SUBJECTIVE:  Sedated on vent  VITAL SIGNS: BP 122/81   Pulse 83   Temp 99.1 F (37.3 C)   Resp 21   Ht  5\' 6"  (1.676 m)   Wt 160 lb (72.6 kg)   SpO2 100%   BMI 25.82 kg/m   HEMODYNAMICS:    VENTILATOR SETTINGS: Vent Mode: PRVC FiO2 (%):  [50 %-100 %] 50 % Set Rate:  [14 bmp] 14 bmp Vt Set:  [480 mL] 480 mL PEEP:  [5 cmH20] 5 cmH20 Plateau Pressure:  [15 cmH20-19 cmH20] 19 cmH20  INTAKE / OUTPUT: I/O last 3 completed shifts: In: 1000 [IV Piggyback:1000] Out: 600 [Urine:600]  PHYSICAL EXAMINATION: General:  Well developed 36 year old female, sedated on vent  Neuro:  Opens eyes briskly, moves all extremities intermittently will follow commands. + nuchal rigidity   HEENT:  Copious clear oral secretions. Orally intubated w/ ETT. Sclera non-icteric  Cardiovascular:  RRR no MRG Lungs:  Clear and no accessory use  Abdomen:  Soft, not tender  Musculoskeletal:  Equal st and bulk  Skin:  Warm and dry   LABS:  BMET  Recent Labs Lab 2016/09/19 0306  NA 132*  K 3.3*  CL 99*  CO2 25  BUN 8  CREATININE 0.73  GLUCOSE 183*    Electrolytes  Recent Labs Lab 09/19/16 0306  CALCIUM 8.5*    CBC  Recent Labs Lab 09-19-2016 0306  WBC 9.6  HGB 12.8  HCT 37.5  PLT 214    Coag's No results for input(s): APTT, INR in the last 168 hours.  Sepsis Markers  Recent Labs Lab 09-19-16 0419  LATICACIDVEN 2.36*    ABG  Recent Labs Lab Sep 19, 2016 0745  PHART 7.413  PCO2ART 32.4  PO2ART 548*    Liver Enzymes  Recent Labs Lab 09/19/2016 0306  AST 20  ALT 16  ALKPHOS 41  BILITOT 0.6  ALBUMIN 3.4*    Cardiac Enzymes No results for input(s): TROPONINI, PROBNP in the last 168 hours.  Glucose  Recent Labs Lab 09-19-2016 0302  GLUCAP 188*    Imaging Ct Head Wo Contrast  Result Date: 09-19-2016 CLINICAL DATA:  Altered mental status. Headaches started around 2200 hours on 08/27/2016. Increasing pain. Nausea and vomiting. No injury. EXAM: CT HEAD WITHOUT CONTRAST TECHNIQUE: Contiguous axial images were obtained from the base of the skull through the vertex without intravenous contrast. COMPARISON:  None. FINDINGS: Brain: No evidence of acute infarction, hemorrhage, hydrocephalus, extra-axial collection or mass lesion/mass effect. Vascular: No hyperdense vessel or unexpected calcification. Skull: Normal. Negative for fracture or focal lesion. Sinuses/Orbits: Mucosal thickening in the paranasal sinuses with opacification of multiple left ethmoid air cells. Diffuse opacification of the right mastoid air cells. Left mastoid air cells are clear. Other: None. IMPRESSION: No acute intracranial abnormalities. Right mastoid effusions. Inflammatory  changes in the paranasal sinuses. Electronically Signed   By: Burman Nieves M.D.   On: Sep 19, 2016 03:55   Dg Chest Portable 1 View  Result Date: 09-19-2016 CLINICAL DATA:  OG tube placement. EXAM: PORTABLE CHEST 1 VIEW 6:54 a.m. COMPARISON:  2016-09-19 at 5:10 a.m. FINDINGS: Endotracheal tube is at the carina and should be retracted approximately 2 cm. OG tube tip is below the diaphragm. Heart size and pulmonary vascularity are normal and the lungs are clear. IMPRESSION: Endotracheal tube at the carina. OG tube in good position. Clear lungs. Electronically Signed   By: Francene Boyers M.D.   On: September 19, 2016 07:11   Dg Chest Portable 1 View  Result Date: Sep 19, 2016 CLINICAL DATA:  Headache and vomiting. Combative. Altered mental status. EXAM: PORTABLE CHEST 1 VIEW COMPARISON:  Chest radiograph April 23, 2014 FINDINGS: Cardiomediastinal silhouette is  normal. Prominent bronchovascular markings. No pleural effusions or focal consolidations. Trachea projects midline and there is no pneumothorax. Soft tissue planes and included osseous structures are non-suspicious. IMPRESSION: Prominent bronchovascular markings suggesting atypical infection or pulmonary edema. Electronically Signed   By: Awilda Metroourtnay  Bloomer M.D.   On: 2016-12-09 05:31     STUDIES:  LP 2/5: cloudy, wbc 1400, segmented neutrophils 94, protein > 600 glucose 47  CULTURES: BCX2 2/5>>> Csf 2/5>>> resp 2/5>>>  ANTIBIOTICS: vanc 2/5>>> Rocephin 2/5>>>  SIGNIFICANT EVENTS:   LINES/TUBES: oett 2/5>>>  DISCUSSION: 36 year old female w/ what is likely strep meningitis. Intubated for airway protection. Received abx. Starting decadron, will await final cultures, cont abx, admit to ICU. Hopefully MS will allow assessment for weaning in next 24-48hrs.   ASSESSMENT / PLAN: Infectious Disease  A Acute Bacterial Meningitis -->looks like this will be strep given GPCs in CSF.  P: F/u blood and csf cultures Send sputum  Empiric vanc and  rocephin Day 0/4 IV decadron 10mg  q 6  NEUROLOGIC A:   Acute Encephalopathy in setting of bacterial meningitis  P:   RASS goal: -2 PAD protocol  See ID section   PULMONARY A: Ventilator dependence in setting of concern for airway protection  P:   Full vent support PAD protocol F/u am CXR & abg Daily assessment for weaning starting 2/6   CARDIOVASCULAR A:  Sepsis in setting of meningitis  P:  Repeat LA Cont IVFs Tele   RENAL A:   Hypokalemia  P:   Replace and recheck   GASTROINTESTINAL A:   No acute issues P:   H2B for SUP Consider tubefeeds if not extubated 2/6  HEMATOLOGIC A:   No acute  P:  [place] heparin to start this evening  Serial cbc Transfuse per protocol    ENDOCRINE A:   Hyperglycemia  P:   ssi      FAMILY  - Updates:   - Inter-disciplinary family meet or Palliative Care meeting due by: 2/12   My critical care time  36 minutes.  Simonne MartinetPeter E Daxon Kyne ACNP-BC [facility] Pulmonary/Critical Care Pager # [phone] OR # [phone] if no answer  08/24/2016, 9:07 AM

## 2016-08-28 NOTE — Progress Notes (Signed)
CRITICAL VALUE ALERT  Critical value received:  LACTIC ACID 3.1  Date of notification:  09/07/2016  Time of notification:  1415  Critical value read back:Yes.    Nurse who received alert:  Estill DoomsASHEENA Arleen Bar RN  MD notified (1st page):  1430  Time of first page:  1430  MD notified (2nd page):  Time of second page:  Responding MD:  CCMD  Time MD responded:  CCMD

## 2016-08-28 NOTE — ED Notes (Signed)
UNABLE TO COLLECT LABS AT THIS TIME PT GOING UP TO RM

## 2016-08-28 NOTE — ED Triage Notes (Signed)
Per husband, pt has been sick the last couple of days. Has been on prednisone. Pt took 1g Tylenol around 11p. Pt not responsive in triage and slouched in chair unable to assist to bed. Will wake up with sternal rub, but will not answer questions. A&Ox4. MD made aware and en route to room.

## 2016-08-28 NOTE — ED Notes (Signed)
Patient is not oriented to person, place, time or situation. Pt occasionally speaks inaudible words with thrashing.

## 2016-08-28 NOTE — Consult Note (Signed)
[facility]    Date of Admission:  09-25-2016           Day 1 vancomycin        Day 1 ceftriaxone       Reason for Consult: Streptococcal meningitis    Referring Physician: Dr. Cyril Mourning   Principal Problem:   Meningitis Active Problems:   Altered mental status   Asthma   Sinusitis   Mastoiditis   . cefTRIAXone (ROCEPHIN)  IV  2 g Intravenous Q12H  . dexamethasone  10 mg Intravenous Q6H  . famotidine (PEPCID) IV  20 mg Intravenous Q12H  . heparin  5,000 Units Subcutaneous Q8H  . insulin aspart  0-15 Units Subcutaneous Q4H  . vancomycin  1,000 mg Intravenous Q8H    Recommendations: 1. Continue vancomycin and ceftriaxone pending final culture results 2. Continue dexamethasone 3. Discontinue droplet precautions  4. HIV antibody  Assessment: Madeline Kelly probably has pneumococcal meningitis. This could be a complication of acute ethmoid sinusitis and mastoiditis. She is currently on appropriate antibiotic therapy and adjunctive steroids. This does not require droplet precautions. I will follow with you.   HPI: Madeline Kelly is a 36 y.o. female With a history of asthma who recently developed sinus congestion, cough and headache. She then developed some right hip and left ankle pain. She was seen at urgent care yesterday morning and was started on prednisone. She told her husband she felt a little bit better by late afternoon but around 10 PM she developed much more severe, pounding headache. Her husband went to the drugstore to purchase acetaminophen. When he returned she was confused with some garbled speech and he brought her to the emergency department. She was febrile. Head CT showed some left ethmoid and right mastoid opacification. No other acute abnormalities were noted. No pneumonia was noted on chest x-ray. She underwent lumbar puncture which revealed 1650 white blood cells with 97% segmented neutrophils. Her glucose was low and her  protein was very high. CSF Gram stain shows gram-positive cocci in pairs. She was intubated to protect her airway.   Review of Systems: Review of Systems  Unable to perform ROS: Intubated    Past Medical History:  Diagnosis Date  . Asthma   . Migraines     Social History  Substance Use Topics  . Smoking status: Never Smoker  . Smokeless tobacco: Never Used  . Alcohol use Yes    Family History  Problem Relation Age of Onset  . Cancer Other    No Known Allergies  OBJECTIVE: Blood pressure 133/79, pulse 81, temperature (!) 100.9 F (38.3 C), resp. rate 14, height 5\' 6"  (1.676 m), weight 160 lb (72.6 kg), SpO2 100 %.  Physical Exam  Constitutional:  She is sedated on the ventilator. Her husband is at the bedside.  Cardiovascular: Normal rate and regular rhythm.   No murmur heard. Pulmonary/Chest: Breath sounds normal. She has no wheezes. She has no rales.  Abdominal: Soft. She exhibits no distension.  Musculoskeletal: Normal range of motion. She exhibits no edema or tenderness.  No unusual warmth, redness or swelling over her right hip or left ankle.  Skin: No rash noted.    Lab Results Lab Results  Component Value Date   WBC 9.6 September 25, 2016   HGB 12.8 09-25-2016   HCT 37.5 Sep 25, 2016   MCV 77.5 (L) Sep 25, 2016   PLT 214 September 25, 2016    Lab Results  Component Value Date  CREATININE 0.73 2016/10/03   BUN 8 2016/10/03   NA 132 (L) 2016/10/03   K 3.3 (L) 2016/10/03   CL 99 (L) 2016/10/03   CO2 25 2016/10/03    Lab Results  Component Value Date   ALT 16 2016/10/03   AST 20 2016/10/03   ALKPHOS 41 2016/10/03   BILITOT 0.6 2016/10/03     Microbiology: Recent Results (from the past 240 hour(s))  Blood culture (routine x 2)     Status: None (Preliminary result)   Collection Time: 05-25-17  4:03 AM  Result Value Ref Range Status   Specimen Description BLOOD RIGHT FOREARM  Final   Special Requests BOTTLES DRAWN AEROBIC AND ANAEROBIC 5ML  Final   Culture   Setup Time   Final    GRAM POSITIVE COCCI IN PAIRS IN BOTH AEROBIC AND ANAEROBIC BOTTLES Organism ID to follow Performed at [facility], [address] [place], [place] [address]    Culture PENDING  Incomplete   Report Status PENDING  Incomplete  CSF culture     Status: None (Preliminary result)   Collection Time: 05-25-17  4:35 AM  Result Value Ref Range Status   Specimen Description CSF  Final   Special Requests NONE  Final   Gram Stain   Final    WBC PRESENT, PREDOMINANTLY PMN GRAM POSITIVE COCCI IN PAIRS CYTOSPIN SMEAR Gram Stain Report Called to,Read Back By and Verified With: COGGINS,IRENE. RN @0545  ON 2.5.18 BY NMCCOY    Culture PENDING  Incomplete   Report Status PENDING  Incomplete  MRSA PCR Screening     Status: None   Collection Time: 05-25-17  9:39 AM  Result Value Ref Range Status   MRSA by PCR NEGATIVE NEGATIVE Final    Comment:        The GeneXpert MRSA Assay (FDA approved for NASAL specimens only), is one component of a comprehensive MRSA colonization surveillance program. It is not intended to diagnose MRSA infection nor to guide or monitor treatment for MRSA infections.     Cliffton AstersJohn Mayre Bury, MD [facility] [phone] pager   [phone] cell 09/10/2016, 6:13 PM

## 2016-08-28 NOTE — Care Management Note (Signed)
Case Management Note  Patient Details  Name: Madeline Kelly MRN: [id number] Date of Birth: 10-11-1980  Subjective/Objective:       Cns infection and coma             Action/Plan:Date:  August 28, 2016 Chart reviewed for concurrent status and case management needs. Will continue to follow patient progress. Discharge Planning: following for needs Expected discharge date: [id number] Marcelle SmilingRhonda Nyriah Coote, BSN, [place], [place]   [phone]   Expected Discharge Date:   (unknown)               Expected Discharge Plan:  Home/Self Care  In-House Referral:     Discharge planning Services     Post Acute Care Choice:    Choice offered to:     DME Arranged:    DME Agency:     HH Arranged:    HH Agency:     Status of Service:  In process, will continue to follow  If discussed at Long Length of Stay Meetings, dates discussed:    Additional Comments:  Golda AcreDavis, Kalayla Shadden Lynn, RN 08/27/2016, 12:45 PM

## 2016-08-29 ENCOUNTER — Inpatient Hospital Stay (HOSPITAL_COMMUNITY): Payer: 59

## 2016-08-29 DIAGNOSIS — R7881 Bacteremia: Secondary | ICD-10-CM | POA: Diagnosis present

## 2016-08-29 DIAGNOSIS — B953 Streptococcus pneumoniae as the cause of diseases classified elsewhere: Secondary | ICD-10-CM | POA: Diagnosis present

## 2016-08-29 DIAGNOSIS — J9600 Acute respiratory failure, unspecified whether with hypoxia or hypercapnia: Secondary | ICD-10-CM

## 2016-08-29 LAB — PHOSPHORUS: Phosphorus: 2.4 mg/dL — ABNORMAL LOW (ref 2.5–4.6)

## 2016-08-29 LAB — BLOOD GAS, ARTERIAL
Acid-base deficit: 0.5 mmol/L (ref 0.0–2.0)
Bicarbonate: 24.4 mmol/L (ref 20.0–28.0)
Drawn by: 232811
FIO2: 35
O2 Saturation: 98.7 %
PCO2 ART: 43.6 mmHg (ref 32.0–48.0)
PEEP: 5 cmH2O
Patient temperature: 37.1
RATE: 14 resp/min
VT: 480 mL
pH, Arterial: 7.367 (ref 7.350–7.450)
pO2, Arterial: 149 mmHg — ABNORMAL HIGH (ref 83.0–108.0)

## 2016-08-29 LAB — BASIC METABOLIC PANEL
Anion gap: 7 (ref 5–15)
BUN: 7 mg/dL (ref 6–20)
CALCIUM: 7.3 mg/dL — AB (ref 8.9–10.3)
CO2: 24 mmol/L (ref 22–32)
CREATININE: 0.48 mg/dL (ref 0.44–1.00)
Chloride: 109 mmol/L (ref 101–111)
GFR calc Af Amer: >60 mL/min (ref >60)
Glucose, Bld: 113 mg/dL — ABNORMAL HIGH (ref 65–99)
Potassium: 3.5 mmol/L (ref 3.5–5.1)
Sodium: 140 mmol/L (ref 135–145)

## 2016-08-29 LAB — PROCALCITONIN: Procalcitonin: 3.96 ng/mL

## 2016-08-29 LAB — ECHOCARDIOGRAM COMPLETE
Height: 66 in
WEIGHTICAEL: 2493.84 [oz_av]

## 2016-08-29 LAB — CBC
HCT: 33.1 % — ABNORMAL LOW (ref 36.0–46.0)
Hemoglobin: 11.3 g/dL — ABNORMAL LOW (ref 12.0–15.0)
MCH: 26.7 pg (ref 26.0–34.0)
MCHC: 34.1 g/dL (ref 30.0–36.0)
MCV: 78.3 fL (ref 78.0–100.0)
PLATELETS: 227 10*3/uL (ref 150–400)
RBC: 4.23 MIL/uL (ref 3.87–5.11)
RDW: 15.6 % — AB (ref 11.5–15.5)
WBC: 16.9 10*3/uL — ABNORMAL HIGH (ref 4.0–10.5)

## 2016-08-29 LAB — MAGNESIUM: MAGNESIUM: 1.8 mg/dL (ref 1.7–2.4)

## 2016-08-29 LAB — GLUCOSE, CAPILLARY
GLUCOSE-CAPILLARY: 117 mg/dL — AB (ref 65–99)
GLUCOSE-CAPILLARY: 183 mg/dL — AB (ref 65–99)
GLUCOSE-CAPILLARY: 128 mg/dL — AB (ref 65–99)
Glucose-Capillary: 153 mg/dL — ABNORMAL HIGH (ref 65–99)

## 2016-08-29 MED ORDER — OXYCODONE-ACETAMINOPHEN 7.5-325 MG PO TABS: 2.0000 | ORAL_TABLET | ORAL | Status: DC | PRN

## 2016-08-29 MED ORDER — ALBUTEROL SULFATE (2.5 MG/3ML) 0.083% IN NEBU: 2.5000 mg | INHALATION_SOLUTION | RESPIRATORY_TRACT | Status: DC | PRN

## 2016-08-29 MED ORDER — AZELASTINE HCL 0.1 % NA SOLN
2.0000 | Freq: Two times a day (BID) | NASAL | Status: DC
  Administered 2016-08-29 – 2016-09-01 (×5): 2 via NASAL
  Filled 2016-08-29 (×2): qty 30

## 2016-08-29 MED ORDER — MORPHINE SULFATE (PF) 2 MG/ML IV SOLN
1.0000 mg | INTRAVENOUS | Status: DC | PRN
  Filled 2016-08-29: qty 1

## 2016-08-29 MED ORDER — MAGNESIUM SULFATE 2 GM/50ML IV SOLN
2.0000 g | Freq: Once | INTRAVENOUS | Status: AC
  Administered 2016-08-29: 2 g via INTRAVENOUS
  Filled 2016-08-29: qty 50

## 2016-08-29 MED ORDER — LIP MEDEX EX OINT
TOPICAL_OINTMENT | CUTANEOUS | Status: AC
Start: 2016-08-29 — End: 2016-08-29
  Administered 2016-08-29: 14:00:00
  Filled 2016-08-29: qty 7

## 2016-08-29 MED ORDER — LIP MEDEX EX OINT: TOPICAL_OINTMENT | CUTANEOUS | Status: DC | PRN

## 2016-08-29 MED ORDER — ALBUTEROL SULFATE (2.5 MG/3ML) 0.083% IN NEBU
2.5000 mg | INHALATION_SOLUTION | RESPIRATORY_TRACT | Status: DC
  Filled 2016-08-29: qty 3

## 2016-08-29 MED ORDER — ACETAMINOPHEN 650 MG RE SUPP: 650.0000 mg | RECTAL | Status: DC | PRN

## 2016-08-29 MED ORDER — ONDANSETRON HCL 4 MG/2ML IJ SOLN
4.0000 mg | Freq: Four times a day (QID) | INTRAMUSCULAR | Status: DC | PRN
  Administered 2016-08-29 – 2016-08-31 (×3): 4 mg via INTRAVENOUS
  Filled 2016-08-29 (×3): qty 2

## 2016-08-29 MED ORDER — POTASSIUM CHLORIDE 20 MEQ/15ML (10%) PO SOLN
20.0000 meq | ORAL | Status: DC
  Filled 2016-08-29 (×2): qty 15

## 2016-08-29 MED ORDER — POTASSIUM CHLORIDE 20 MEQ/15ML (10%) PO SOLN
20.0000 meq | ORAL | Status: AC
  Administered 2016-08-29: 20 meq via ORAL

## 2016-08-29 MED ORDER — IBUPROFEN 200 MG PO TABS
600.0000 mg | ORAL_TABLET | Freq: Three times a day (TID) | ORAL | Status: DC | PRN
  Administered 2016-08-29 – 2016-08-30 (×2): 600 mg via ORAL
  Filled 2016-08-29 (×2): qty 3

## 2016-08-29 NOTE — Progress Notes (Signed)
PULMONARY / CRITICAL CARE MEDICINE   Name: Madeline FusiKathryn Kelly MRN: [id number] DOB: 1980-08-11    ADMISSION DATE:  09/09/2016 CONSULTATION DATE:  2/5  REFERRING MD:  schlossman  CHIEF COMPLAINT:  meningitis    SUBJECTIVE:  No distress. Passed SBT  VITAL SIGNS: BP (!) 103/55 (BP Location: Right Arm)   Pulse 71   Temp 98.4 F (36.9 C)   Resp 14   Ht 5\' 6"  (1.676 m)   Wt 155 lb 13.8 oz (70.7 kg)   SpO2 100%   BMI 25.16 kg/m   HEMODYNAMICS:    VENTILATOR SETTINGS: Vent Mode: PSV;CPAP FiO2 (%):  [30 %-40 %] 30 % Set Rate:  [14 bmp] 14 bmp Vt Set:  [480 mL] 480 mL PEEP:  [5 cmH20] 5 cmH20 Pressure Support:  [5 cmH20] 5 cmH20 Plateau Pressure:  [13 cmH20-20 cmH20] 17 cmH20  INTAKE / OUTPUT: I/O last 3 completed shifts: In: 4624.5 [I.V.:2609.5; IV Piggyback:2015] Out: 3925 [Urine:3925]  PHYSICAL EXAMINATION: General appearance:  36 Year old  female, well nourished/NAD, currently in tearful. Follows commands Eyes: anicteric sclerae moist conjunctivae; PERRL, EOMI bilaterally. Mouth:  membranes and no mucosal ulcerations; normal hard and soft palate Neck: Trachea midline; neck supple, no JVD, ETT good position  Lungs/chest: scattered rhonchi. Excellent Vt.  CV: RRR, no MRGs  Abdomen: Soft, non-tender; no masses or HSM Extremities: No peripheral edema or extremity lymphadenopathy Skin: Normal temperature, turgor and texture; no rash, ulcers or subcutaneous nodules Neuro/Psych: Appropriate affect, alert and oriented to person, place and time  LABS:  BMET  Recent Labs Lab 09/11/2016 0306 08/29/16 0339  NA 132* 140  K 3.3* 3.5  CL 99* 109  CO2 25 24  BUN 8 7  CREATININE 0.73 0.48  GLUCOSE 183* 113*    Electrolytes  Recent Labs Lab 09/20/2016 0306 08/29/16 0339  CALCIUM 8.5* 7.3*  MG  --  1.8  PHOS  --  2.4*    CBC  Recent Labs Lab 09/15/2016 0306 08/29/16 0339  WBC 9.6 16.9*  HGB 12.8 11.3*  HCT 37.5 33.1*  PLT 214 227    Coag's  Recent  Labs Lab 08/30/2016 0954  APTT 32  INR 1.27    Sepsis Markers  Recent Labs Lab 09/20/2016 0305 08/27/2016 0419 09/13/2016 0954 09/16/2016 1338 08/29/16 0339  LATICACIDVEN  --  2.36* 3.5* 3.1*  --   PROCALCITON 0.21  --   --   --  3.96    ABG  Recent Labs Lab 08/30/2016 0745 08/29/16 0445  PHART 7.413 7.367  PCO2ART 32.4 43.6  PO2ART 548* 149*    Liver Enzymes  Recent Labs Lab 08/26/2016 0306  AST 20  ALT 16  ALKPHOS 41  BILITOT 0.6  ALBUMIN 3.4*    Cardiac Enzymes No results for input(s): TROPONINI, PROBNP in the last 168 hours.  Glucose  Recent Labs Lab 09/08/2016 1401 09/19/2016 1652 09/14/2016 2132 08/29/16 0015 08/29/16 0342 08/29/16 0747  GLUCAP 139* 139* 123* 153* 117* 183*    Imaging Dg Chest Port 1 View  Result Date: 08/29/2016 CLINICAL DATA:  36 year old female with endotracheal tube positioning. Asthma. Subsequent encounter. EXAM: PORTABLE CHEST 1 VIEW COMPARISON:  09/17/2016. FINDINGS: Endotracheal tube tip 2.1 cm above the carina. Nasogastric tube courses below the diaphragm. Tip is not included on the present exam. Pulmonary vascular congestion. No segmental consolidation or gross pneumothorax. Heart size within normal limits. IMPRESSION: Endotracheal tube tip 2.1 cm above the carina. Pulmonary vascular congestion. Electronically Signed   By: Lacy DuverneySteven  Olson  M.D.   On: 08/29/2016 06:56   Dg Chest Port 1 View  Result Date: 08/24/2016 CLINICAL DATA:  Endotracheal tube repositioning EXAM: PORTABLE CHEST 1 VIEW COMPARISON:  Radiograph 09/14/2016 FINDINGS: Endotracheal tube is minimally withdrawn measuring approximately 1.7 cm from carina compared to 0.5 cm. NG tube in stomach. Lungs are clear. IMPRESSION: 1. Minimal withdrawal of the endotracheal to 1.7 cm from carina. 2. Lungs are clear. Electronically Signed   By: Genevive Bi M.D.   On: 09/16/2016 09:23  vascular congestion but no clear infiltrates   STUDIES:  LP 2/5: cloudy, wbc 1400, segmented  neutrophils 94, protein > 600 glucose 47  CULTURES: BCX2 2/5>>>gpc Csf 2/5>>>gpc resp 2/5>>>  ANTIBIOTICS: vanc 2/5>>> Rocephin 2/5>>>  SIGNIFICANT EVENTS:   LINES/TUBES: oett 2/5>>>2/6     ASSESSMENT / PLAN:  Acute Bacterial Meningitis and GPC bacteremia  -->looks like this will be strep given GPCs in CSF & blood. ? D/t ethmoid/mastoid sinusitis?  ->ID following  P: F/u cultures and sensitives  No change in vanc/rocephin for now Day 1/4 IV decadron ECHO  Acute Encephalopathy in setting of bacterial meningitis  Plan Dc sedation  Tylenol for pain   Ventilator dependence d/t AMS -->passed SBT Plan Extubate Wean O2 mobilize  anemia-->likely dilutional  Plan:  [place] heparin F/u CBC   Hyperglycemia Plan: ssi  FAMILY  - Updates:   - Inter-disciplinary family meet or Palliative Care meeting due by: 2/12  Discussion Looks better. Will extubate. Cont IVABX and steroids.  Needs echo given bacteremia.  ID following. If no issues she can go out to tele later today or tomorrow.  IM service to pick up tomorrow.   Simonne Martinet ACNP-BC [facility] Pulmonary/Critical Care Pager # [phone] OR # [phone] if no answer    08/29/2016, 8:47 AM

## 2016-08-29 NOTE — Progress Notes (Signed)
RN wasted 210 ml of Fentanyl in sink with Zoe Suggs RN.

## 2016-08-29 NOTE — Progress Notes (Signed)
eLink Nursing ICU Electrolyte Replacement Protocol  Patient Name: Madeline FusiKathryn Koepp DOB: [birth year] MRN: [id number]  Date of Service  08/29/2016   HPI/Events of Note    Recent Labs Lab 09/11/2016 0306 08/29/16 0339  NA 132* 140  K 3.3* 3.5  CL 99* 109  CO2 25 24  GLUCOSE 183* 113*  BUN 8 7  CREATININE 0.73 0.48  CALCIUM 8.5* 7.3*  MG  --  1.8  PHOS  --  2.4*    Estimated Creatinine Clearance: 91.9 mL/min (by C-G formula based on SCr of 0.48 mg/dL).  Intake/Output      02/05 0701 - 02/06 0700   I.V. (mL/kg) 2490.9 (35.2)   IV Piggyback 1015   Total Intake(mL/kg) 3505.9 (49.6)   Urine (mL/kg/hr) 3325 (2)   Total Output 3325   Net +180.9        - I/O DETAILED x24h    Total I/O In: 1854.1 [I.V.:1354.1; IV Piggyback:500] Out: 1200 [Urine:1200] - I/O THIS SHIFT    ASSESSMENT   eICURN Interventions  Pt labs being replaced using ICU electrolyte protocol   ASSESSMENT: MAJOR ELECTROLYTE    Merita NortonFrye, Londin Antone Nicole 08/29/2016, 6:38 AM

## 2016-08-29 NOTE — Procedures (Signed)
Extubation Procedure Note  Patient Details:   Name: Paulina FusiKathryn Fonder DOB: [birth year] MRN: [id number]   Airway Documentation:     Evaluation  O2 sats: stable throughout Complications: No apparent complications Patient did tolerate procedure well. Bilateral Breath Sounds: Clear, Diminished   Yes  Kendall FlackJackson, Tericka Devincenzi Royal 08/29/2016, 8:44 AM

## 2016-08-29 NOTE — Progress Notes (Signed)
  Echocardiogram 2D Echocardiogram has been performed.  Yarixa Lightcap L Androw 08/29/2016, 12:47 PM

## 2016-08-29 NOTE — Progress Notes (Signed)
[facility] for Infectious Disease  Date of Admission:  08/30/2016           Day 2 vancomycin        Day 2 ceftriaxone  Principal Problem:   Pneumococcal meningitis Active Problems:   Pneumococcal bacteremia   Asthma   Sinusitis   Mastoiditis   . azelastine  2 spray Each Nare BID  . cefTRIAXone (ROCEPHIN)  IV  2 g Intravenous Q12H  . chlorhexidine gluconate (MEDLINE KIT)  15 mL Mouth Rinse BID  . dexamethasone  10 mg Intravenous Q6H  . heparin  5,000 Units Subcutaneous Q8H  . insulin aspart  0-15 Units Subcutaneous Q4H  . mouth rinse  15 mL Mouth Rinse QID  . potassium chloride  20 mEq Oral Q4H  . vancomycin  1,000 mg Intravenous Q8H    SUBJECTIVE: She still has some headache but she is feeling much better. She does not have much recall about coming to the hospital. She states that she has been told by her primary care doctor that there is a lot of fluid draining from both ears. She has never done anything about it. She's had problems with sinus congestion.  Review of Systems: Review of Systems  Constitutional: Positive for malaise/fatigue. Negative for chills, diaphoresis, fever and weight loss.  HENT: Positive for congestion and ear discharge. Negative for ear pain, hearing loss and sore throat.   Respiratory: Negative for cough, sputum production and shortness of breath.   Cardiovascular: Negative for chest pain.  Gastrointestinal: Negative for abdominal pain, diarrhea, heartburn, nausea and vomiting.  Genitourinary: Negative for dysuria and frequency.  Musculoskeletal: Negative for joint pain and myalgias.  Skin: Negative for rash.  Neurological: Positive for weakness and headaches. Negative for dizziness.    Past Medical History:  Diagnosis Date  . Asthma   . Migraines     Social History  Substance Use Topics  . Smoking status: Never Smoker  . Smokeless tobacco: Never Used  . Alcohol use Yes    Family History  Problem Relation Age of Onset    . Cancer Other    No Known Allergies  OBJECTIVE: Vitals:   08/29/16 1200 08/29/16 1216 08/29/16 1300 08/29/16 1400  BP:  (!) 130/97 (!) 116/57 119/72  Pulse: 73 86 73 82  Resp: 16 (!) _0 Temp: 98.4 F (36.9 C) 98.4 F (36.9 C) 98.8 F (37.1 C) 99 F (37.2 C)  TempSrc:      SpO2: 100% 95% 100% 96%  Weight:      Height:       Body mass index is 25.16 kg/m.  Physical Exam  Constitutional: She is oriented to person, place, and time.  She is now extubated. She is alert and conversant. She became tearful when talking about how sick she had been. Multiple family members are at the bedside.  HENT:  Mouth/Throat: No oropharyngeal exudate.  Eyes: Conjunctivae are normal.  Cardiovascular: Normal rate and regular rhythm.   No murmur heard. Pulmonary/Chest: Effort normal and breath sounds normal.  Abdominal: Soft. She exhibits no mass. There is no tenderness.  Musculoskeletal: Normal range of motion.  Neurological: She is alert and oriented to person, place, and time.  Skin: No rash noted.  Psychiatric: Mood and affect normal.    Lab Results Lab Results  Component Value Date   WBC 16.9 (H) 08/29/2016   HGB 11.3 (L) 08/29/2016   HCT 33.1 (L) 08/29/2016  MCV 78.3 08/29/2016   PLT 227 08/29/2016    Lab Results  Component Value Date   CREATININE 0.48 08/29/2016   BUN 7 08/29/2016   NA 140 08/29/2016   K 3.5 08/29/2016   CL 109 08/29/2016   CO2 24 08/29/2016    Lab Results  Component Value Date   ALT 16 08/27/2016   AST 20 08/31/2016   ALKPHOS 41 09/16/2016   BILITOT 0.6 09/02/2016     Microbiology: Recent Results (from the past 240 hour(s))  Blood culture (routine x 2)     Status: Abnormal (Preliminary result)   Collection Time: 09/10/2016  3:53 AM  Result Value Ref Range Status   Specimen Description BLOOD LEFT FOREARM  Final   Special Requests BOTTLES DRAWN AEROBIC AND ANAEROBIC 5ML  Final   Culture  Setup Time   Final    GRAM POSITIVE COCCI IN  PAIRS IN BOTH AEROBIC AND ANAEROBIC BOTTLES CRITICAL VALUE NOTED.  VALUE IS CONSISTENT WITH PREVIOUSLY REPORTED AND CALLED VALUE. Performed at [facility], [place] [address], [place], [place] [address]    Culture STREPTOCOCCUS PNEUMONIAE (A)  Final   Report Status PENDING  Incomplete  Blood culture (routine x 2)     Status: Abnormal (Preliminary result)   Collection Time: 08/27/2016  4:03 AM  Result Value Ref Range Status   Specimen Description BLOOD RIGHT FOREARM  Final   Special Requests BOTTLES DRAWN AEROBIC AND ANAEROBIC 5ML  Final   Culture  Setup Time   Final    GRAM POSITIVE COCCI IN PAIRS IN BOTH AEROBIC AND ANAEROBIC BOTTLES CRITICAL RESULT CALLED TO, READ BACK BY AND VERIFIED WITH: L. Omar Person, 09/08/2016 AT 1934 BY J FUDESCO Performed at [facility], [place] [address] [place], [place] [address]    Culture STREPTOCOCCUS PNEUMONIAE (A)  Final   Report Status PENDING  Incomplete  Blood Culture ID Panel (Reflexed)     Status: Abnormal   Collection Time: 08/26/2016  4:03 AM  Result Value Ref Range Status   Enterococcus species NOT DETECTED NOT DETECTED Final   Listeria monocytogenes NOT DETECTED NOT DETECTED Final   Staphylococcus species NOT DETECTED NOT DETECTED Final   Staphylococcus aureus NOT DETECTED NOT DETECTED Final   Streptococcus species DETECTED (A) NOT DETECTED Final   Streptococcus agalactiae NOT DETECTED NOT DETECTED Final   Streptococcus pneumoniae DETECTED (A) NOT DETECTED Final    Comment: CRITICAL RESULT CALLED TO, READ BACK BY AND VERIFIED WITH: C. BALL, PHARM, 09/15/2016 AT 2120 BY J FUDESCO    Streptococcus pyogenes NOT DETECTED NOT DETECTED Final   Acinetobacter baumannii NOT DETECTED NOT DETECTED Final   Enterobacteriaceae species NOT DETECTED NOT DETECTED Final   Enterobacter cloacae complex NOT DETECTED NOT DETECTED Final   Escherichia coli NOT DETECTED NOT DETECTED Final   Klebsiella oxytoca NOT DETECTED NOT DETECTED Final   Klebsiella  pneumoniae NOT DETECTED NOT DETECTED Final   Proteus species NOT DETECTED NOT DETECTED Final   Serratia marcescens NOT DETECTED NOT DETECTED Final   Haemophilus influenzae NOT DETECTED NOT DETECTED Final   Neisseria meningitidis NOT DETECTED NOT DETECTED Final   Pseudomonas aeruginosa NOT DETECTED NOT DETECTED Final    Comment: CRITICAL RESULT CALLED TO, READ BACK BY AND VERIFIED WITH: C. BAL, PHARM, 09/09/2016 AT 2120 BY J FUDESCO    Candida albicans NOT DETECTED NOT DETECTED Final   Candida glabrata NOT DETECTED NOT DETECTED Final   Candida krusei NOT DETECTED NOT DETECTED Final   Candida  parapsilosis NOT DETECTED NOT DETECTED Final   Candida tropicalis NOT DETECTED NOT DETECTED Final    Comment: Performed at [facility], [place] [address] [place], [place] [address]  CSF culture     Status: None (Preliminary result)   Collection Time: 09/09/2016  4:35 AM  Result Value Ref Range Status   Specimen Description CSF  Final   Special Requests NONE  Final   Gram Stain   Final    WBC PRESENT, PREDOMINANTLY PMN GRAM POSITIVE COCCI IN PAIRS CYTOSPIN SMEAR Gram Stain Report Called to,Read Back By and Verified With: COGGINS,IRENE. RN _0  ON 2.5.18 BY NMCCOY    Culture   Final    MODERATE STREPTOCOCCUS PNEUMONIAE CULTURE REINCUBATED FOR BETTER GROWTH Performed at [facility], [place] [address] [place], [place] [address]    Report Status PENDING  Incomplete  MRSA PCR Screening     Status: None   Collection Time: 09/11/2016  9:39 AM  Result Value Ref Range Status   MRSA by PCR NEGATIVE NEGATIVE Final    Comment:        The GeneXpert MRSA Assay (FDA approved for NASAL specimens only), is one component of a comprehensive MRSA colonization surveillance program. It is not intended to diagnose MRSA infection nor to guide or monitor treatment for MRSA infections.      ASSESSMENT: She is improving rapidly on therapy for pneumococcal bacteremia and meningitis. I will continue her  current 2 antibiotic regimen pending antibiotic susceptibility results. She needs 2 more days of adjuvant dexamethasone.  PLAN: 1. Continue current antibiotics and dexamethasone  Michel Bickers, MD [facility] [place] [phone] pager   [phone] cell 08/29/2016, 4:45 PM

## 2016-08-30 DIAGNOSIS — J322 Chronic ethmoidal sinusitis: Secondary | ICD-10-CM

## 2016-08-30 LAB — CULTURE, BLOOD (ROUTINE X 2)

## 2016-08-30 LAB — BASIC METABOLIC PANEL
Anion gap: 9 (ref 5–15)
BUN: 13 mg/dL (ref 6–20)
CHLORIDE: 106 mmol/L (ref 101–111)
CO2: 24 mmol/L (ref 22–32)
CREATININE: 0.54 mg/dL (ref 0.44–1.00)
Calcium: 8 mg/dL — ABNORMAL LOW (ref 8.9–10.3)
GFR calc Af Amer: >60 mL/min (ref >60)
GFR calc non Af Amer: >60 mL/min (ref >60)
GLUCOSE: 202 mg/dL — AB (ref 65–99)
POTASSIUM: 3.5 mmol/L (ref 3.5–5.1)
Sodium: 139 mmol/L (ref 135–145)

## 2016-08-30 LAB — GLUCOSE, CAPILLARY
GLUCOSE-CAPILLARY: 197 mg/dL — AB (ref 65–99)
Glucose-Capillary: 211 mg/dL — ABNORMAL HIGH (ref 65–99)
Glucose-Capillary: 164 mg/dL — ABNORMAL HIGH (ref 65–99)
Glucose-Capillary: 149 mg/dL — ABNORMAL HIGH (ref 65–99)

## 2016-08-30 LAB — HIV ANTIBODY (ROUTINE TESTING W REFLEX): HIV SCREEN 4TH GENERATION: NONREACTIVE

## 2016-08-30 LAB — HERPES SIMPLEX VIRUS(HSV) DNA BY PCR
HSV 1 DNA: NEGATIVE
HSV 2 DNA: NEGATIVE

## 2016-08-30 LAB — MAGNESIUM: Magnesium: 2.3 mg/dL (ref 1.7–2.4)

## 2016-08-30 LAB — VANCOMYCIN, TROUGH: VANCOMYCIN TR: 10 ug/mL — AB (ref 15–20)

## 2016-08-30 MED ORDER — INSULIN ASPART 100 UNIT/ML ~~LOC~~ SOLN
0.0000 [IU] | Freq: Three times a day (TID) | SUBCUTANEOUS | Status: DC
  Administered 2016-08-30 – 2016-08-31 (×3): 2 [IU] via SUBCUTANEOUS
  Administered 2016-08-31: 3 [IU] via SUBCUTANEOUS

## 2016-08-30 MED ORDER — ACETAMINOPHEN 325 MG PO TABS
650.0000 mg | ORAL_TABLET | Freq: Four times a day (QID) | ORAL | Status: DC | PRN
  Administered 2016-08-30 (×2): 650 mg via ORAL
  Filled 2016-08-30 (×2): qty 2

## 2016-08-30 MED ORDER — VANCOMYCIN HCL 10 G IV SOLR
1500.0000 mg | Freq: Three times a day (TID) | INTRAVENOUS | Status: DC
  Administered 2016-08-30 – 2016-08-31 (×3): 1500 mg via INTRAVENOUS
  Filled 2016-08-30 (×6): qty 1500

## 2016-08-30 NOTE — Progress Notes (Signed)
Patient ID: Madeline FusiKathryn Kelly, female   DOB: 1981/05/08, 36 y.o.   MRN: [id number]          [facility]    Date of Admission:  08/31/2016    Total days of antibiotics 3        Day 3 dexamethasone  She continues to improve on therapy for pneumococcal bacteremia and meningitis. She is currently sleeping quietly in bed. I did not awaken her. Her husband is at the bedside. He states that she had some nausea without vomiting overnight but is hungry and wants to eat. Her headache is much better and has been controllable with NSAIDs. He feels like her mental status is completely back to baseline with no problems with cognition, speech, hearing or vision. Her antibiotic susceptibilities are still pending so I will continue vancomycin and ceftriaxone. She needs dexamethasone for 1 more day. I'm planning on at least 10 days of IV antibiotic therapy followed by several weeks of oral antibiotic therapy to clear her (probable ) chronic sinusitis and right mastoiditis. He would be okay with me to transfer her to a regular floor.         Cliffton AstersJohn Charvis Lightner, MD [facility] [phone] pager   [phone] cell 07/27/2015, 1:32 PM

## 2016-08-30 NOTE — Progress Notes (Signed)
PROGRESS NOTE  Madeline FusiKathryn Kelly  ZOX:[id number] DOB: August 06, 1980 DOA: 08/31/2016 PCP: No PCP Per Patient Outpatient Specialists:  None  Brief Narrative:  Madeline DeistKathryn is a 36 year old female with a PMH of migraines and asthma who presented with confusion and garbled speech following a few days of sinus congestion, headache, and cough. She was subsequently found to have pneumococcal meningitis and bacteremia. She was intubated from 2/5-2/6 for airway protection and was transferred out of the ICU on 2/7.  Assessment & Plan:   Principal Problem:   Pneumococcal meningitis Active Problems:   Asthma   Sinusitis   Mastoiditis   Pneumococcal bacteremia  Pneumococcal Meningitis and Bacteremia: Possibly due to sinusitis/right mastoiditis. Mentation has returned back to normal.  - Continue Vanc and Rocephin. De-escalate pending culture sensitivities. Per ID, will need 10-14 days IV abx followed by several weeks of po abx.  - Continue IV Decadron 10mg  q6hrs (Day 3/4) - ID following, appreciate recommendations. Will need 10-14 days of IV abx followed by several weeks of likely Amoxicillin for chronic sinusitis.  - ECHO performed due to bacteremia- no evidence of vegetation. - Ibuprofen, Tylenol, and Percocet prn for pain  Hyperglycemia: Likely worsened with Decadron. - Start sensitive SSI - CBGs with meals  Asthma: Lungs clear. - Albuterol prn  DVT prophylaxis: Heparin Code Status: Full Family Communication: Husband present at bedside, updated on plan. Disposition Plan: Transfer out of stepdown to med-surg floor today. Pending continued clinical improvement. Anticipate discharge home in 2 days.  Consultants:   Infectious disease  Procedures:   Endotracheal intubation (2/5-2/6)  ECHO (2/6)- No evidence of vegetation  Antimicrobials:   Vancomycin and Rocephin (2/5-)  Subjective: Pt states she has had some nausea overnight that is much improved with Zofran. She continues to have a  headache, but it is much better than when she first came in. She states she is hungry and would like to eat this morning. She denies changes in vision or hearing. Her husband states that she has been acting completely normal overnight.  Objective: Vitals:   08/30/16 0200 08/30/16 0308 08/30/16 0400 08/30/16 0500  BP: 122/87  106/61   Pulse:   77   Resp: 14  (!) 25   Temp:  99.2 F (37.3 C)    TempSrc:  Axillary    SpO2:   100%   Weight:    157 lb 13.6 oz (71.6 kg)  Height:        Intake/Output Summary (Last 24 hours) at 08/30/16 0813 Last data filed at 08/30/16 0600  Gross per 24 hour  Intake             1675 ml  Output              650 ml  Net             1025 ml   Filed Weights   September 08, 2016 0355 08/29/16 0452 08/30/16 0500  Weight: 160 lb (72.6 kg) 155 lb 13.8 oz (70.7 kg) 157 lb 13.6 oz (71.6 kg)    Examination:  General exam: Tired-appearing but alert, answering questions appropriately Respiratory system: Clear to auscultation. Respiratory effort normal. Cardiovascular system: S1 & S2 heard, RRR. No JVD, murmurs, rubs, gallops or clicks. No pedal edema. Gastrointestinal system: Abdomen is nondistended, soft and nontender. No organomegaly or masses felt. Normal bowel sounds heard. Central nervous system: Alert and oriented x 3. No focal neurological deficits. Extremities: Moves all extremities spontaneously Skin: No rashes, lesions or ulcers Psychiatry: Judgement  and insight appear normal. Mood & affect appropriate.    Data Reviewed: I have personally reviewed following labs and imaging studies  CBC:  Recent Labs Lab September 06, 2016 0306 08/29/16 0339  WBC 9.6 16.9*  NEUTROABS 7.6  --   HGB 12.8 11.3*  HCT 37.5 33.1*  MCV 77.5* 78.3  PLT 214 227   Basic Metabolic Panel:  Recent Labs Lab 09-06-2016 0306 08/29/16 0339 08/30/16 0510  NA 132* 140 139  K 3.3* 3.5 3.5  CL 99* 109 106  CO2 25 24 24   GLUCOSE 183* 113* 202*  BUN 8 7 13   CREATININE 0.73 0.48 0.54    CALCIUM 8.5* 7.3* 8.0*  MG  --  1.8 2.3  PHOS  --  2.4*  --    GFR: Estimated Creatinine Clearance: 99.5 mL/min (by C-G formula based on SCr of 0.54 mg/dL). Liver Function Tests:  Recent Labs Lab Sep 06, 2016 0306  AST 20  ALT 16  ALKPHOS 41  BILITOT 0.6  PROT 7.5  ALBUMIN 3.4*    Recent Labs Lab 09/06/2016 0306  LIPASE 15   No results for input(s): AMMONIA in the last 168 hours. Coagulation Profile:  Recent Labs Lab 06-Sep-2016 0954  INR 1.27   Cardiac Enzymes: No results for input(s): CKTOTAL, CKMB, CKMBINDEX, TROPONINI in the last 168 hours. BNP (last 3 results) No results for input(s): PROBNP in the last 8760 hours. HbA1C: No results for input(s): HGBA1C in the last 72 hours. CBG:  Recent Labs Lab 08/29/16 0015 08/29/16 0342 08/29/16 0747 08/29/16 1139 08/30/16 0755  GLUCAP 153* 117* 183* 128* 211*   Lipid Profile:  Recent Labs  06-Sep-2016 0306  TRIG 69   Thyroid Function Tests: No results for input(s): TSH, T4TOTAL, FREET4, T3FREE, THYROIDAB in the last 72 hours. Anemia Panel: No results for input(s): VITAMINB12, FOLATE, FERRITIN, TIBC, IRON, RETICCTPCT in the last 72 hours. Urine analysis:    Component Value Date/Time   COLORURINE YELLOW 04/23/2014 0129   APPEARANCEUR CLEAR 04/23/2014 0129   LABSPEC 1.022 04/23/2014 0129   PHURINE 6.0 04/23/2014 0129   GLUCOSEU NEGATIVE 04/23/2014 0129   HGBUR NEGATIVE 04/23/2014 0129   BILIRUBINUR NEGATIVE 04/23/2014 0129   KETONESUR NEGATIVE 04/23/2014 0129   PROTEINUR NEGATIVE 04/23/2014 0129   UROBILINOGEN 1.0 04/23/2014 0129   NITRITE NEGATIVE 04/23/2014 0129   LEUKOCYTESUR NEGATIVE 04/23/2014 0129   Sepsis Labs: @LABRCNTIP (procalcitonin:4,lacticidven:4)  ) Recent Results (from the past 240 hour(s))  Blood culture (routine x 2)     Status: Abnormal (Preliminary result)   Collection Time: 09/06/2016  3:53 AM  Result Value Ref Range Status   Specimen Description BLOOD LEFT FOREARM  Final   Special  Requests BOTTLES DRAWN AEROBIC AND ANAEROBIC  Final   Culture  Setup Time   Final    GRAM POSITIVE COCCI IN PAIRS IN BOTH AEROBIC AND ANAEROBIC BOTTLES CRITICAL VALUE NOTED.  VALUE IS CONSISTENT WITH PREVIOUSLY REPORTED AND CALLED VALUE. Performed at [facility], [address], [place], [place] [address]    Culture STREPTOCOCCUS PNEUMONIAE (A)  Final   Report Status PENDING  Incomplete  Blood culture (routine x 2)     Status: Abnormal (Preliminary result)   Collection Time: September 06, 2016  4:03 AM  Result Value Ref Range Status   Specimen Description BLOOD RIGHT FOREARM  Final   Special Requests BOTTLES DRAWN AEROBIC AND ANAEROBIC  Final   Culture  Setup Time   Final    GRAM POSITIVE COCCI IN PAIRS IN BOTH AEROBIC  AND ANAEROBIC BOTTLES CRITICAL RESULT CALLED TO, READ BACK BY AND VERIFIED WITH: L. Otho Darner, September 26, 2016 AT 1934 BY J FUDESCO Performed at [facility], [address] [place], [place] [address]    Culture STREPTOCOCCUS PNEUMONIAE (A)  Final   Report Status PENDING  Incomplete  Blood Culture ID Panel (Reflexed)     Status: Abnormal   Collection Time: 2016/09/26  4:03 AM  Result Value Ref Range Status   Enterococcus species NOT DETECTED NOT DETECTED Final   Listeria monocytogenes NOT DETECTED NOT DETECTED Final   Staphylococcus species NOT DETECTED NOT DETECTED Final   Staphylococcus aureus NOT DETECTED NOT DETECTED Final   Streptococcus species DETECTED (A) NOT DETECTED Final   Streptococcus agalactiae NOT DETECTED NOT DETECTED Final   Streptococcus pneumoniae DETECTED (A) NOT DETECTED Final    Comment: CRITICAL RESULT CALLED TO, READ BACK BY AND VERIFIED WITH: C. BALL, PHARM, 09/26/2016 AT 2120 BY J FUDESCO    Streptococcus pyogenes NOT DETECTED NOT DETECTED Final   Acinetobacter baumannii NOT DETECTED NOT DETECTED Final   Enterobacteriaceae species NOT DETECTED NOT DETECTED Final   Enterobacter cloacae complex NOT DETECTED NOT DETECTED Final    Escherichia coli NOT DETECTED NOT DETECTED Final   Klebsiella oxytoca NOT DETECTED NOT DETECTED Final   Klebsiella pneumoniae NOT DETECTED NOT DETECTED Final   Proteus species NOT DETECTED NOT DETECTED Final   Serratia marcescens NOT DETECTED NOT DETECTED Final   Haemophilus influenzae NOT DETECTED NOT DETECTED Final   Neisseria meningitidis NOT DETECTED NOT DETECTED Final   Pseudomonas aeruginosa NOT DETECTED NOT DETECTED Final    Comment: CRITICAL RESULT CALLED TO, READ BACK BY AND VERIFIED WITH: C. BAL, PHARM, 09/26/16 AT 2120 BY J FUDESCO    Candida albicans NOT DETECTED NOT DETECTED Final   Candida glabrata NOT DETECTED NOT DETECTED Final   Candida krusei NOT DETECTED NOT DETECTED Final   Candida parapsilosis NOT DETECTED NOT DETECTED Final   Candida tropicalis NOT DETECTED NOT DETECTED Final    Comment: Performed at [facility], [address], [place], [place] [address]  CSF culture     Status: None (Preliminary result)   Collection Time: 2016-09-26  4:35 AM  Result Value Ref Range Status   Specimen Description CSF  Final   Special Requests NONE  Final   Gram Stain   Final    WBC PRESENT, PREDOMINANTLY PMN GRAM POSITIVE COCCI IN PAIRS CYTOSPIN SMEAR Gram Stain Report Called to,Read Back By and Verified With: COGGINS,IRENE. RN @0545  ON 2016/09/26 BY NMCCOY    Culture   Final    MODERATE STREPTOCOCCUS PNEUMONIAE CULTURE REINCUBATED FOR BETTER GROWTH Performed at [facility], [address], [place], [place] [address]    Report Status PENDING  Incomplete  MRSA PCR Screening     Status: None   Collection Time: 09-26-2016  9:39 AM  Result Value Ref Range Status   MRSA by PCR NEGATIVE NEGATIVE Final    Comment:        The GeneXpert MRSA Assay (FDA approved for NASAL specimens only), is one component of a comprehensive MRSA colonization surveillance program. It is not intended to diagnose MRSA infection nor to guide or monitor treatment for MRSA infections.            Radiology Studies: Dg Chest Port 1 View  Result Date: 08/29/2016 CLINICAL DATA:  36 year old female with endotracheal tube positioning. Asthma. Subsequent encounter. EXAM: PORTABLE CHEST 1 VIEW COMPARISON:  2016-09-26. FINDINGS: Endotracheal  tube tip 2.1 cm above the carina. Nasogastric tube courses below the diaphragm. Tip is not included on the present exam. Pulmonary vascular congestion. No segmental consolidation or gross pneumothorax. Heart size within normal limits. IMPRESSION: Endotracheal tube tip 2.1 cm above the carina. Pulmonary vascular congestion. Electronically Signed   By: Lacy Duverney M.D.   On: 08/29/2016 06:56   Dg Chest Port 1 View  Result Date: 08/27/2016 CLINICAL DATA:  Endotracheal tube repositioning EXAM: PORTABLE CHEST 1 VIEW COMPARISON:  Radiograph 09/02/2016 FINDINGS: Endotracheal tube is minimally withdrawn measuring approximately 1.7 cm from carina compared to 0.5 cm. NG tube in stomach. Lungs are clear. IMPRESSION: 1. Minimal withdrawal of the endotracheal to 1.7 cm from carina. 2. Lungs are clear. Electronically Signed   By: Genevive Bi M.D.   On: 08/25/2016 09:23     Scheduled Meds: . azelastine  2 spray Each Nare BID  . cefTRIAXone (ROCEPHIN)  IV  2 g Intravenous Q12H  . dexamethasone  10 mg Intravenous Q6H  . heparin  5,000 Units Subcutaneous Q8H  . vancomycin  1,500 mg Intravenous Q8H   Continuous Infusions: . sodium chloride 50 mL/hr at 08/30/16 0600     LOS: 2 days     Hilton Sinclair, MD [facility] Medicine Resident PGY-2 Pager # [phone]  Attending Dr. Jarvis Newcomer Pager # [phone]  If 7PM-7AM, please contact night-coverage [email or web address] Password [facility] 08/30/2016, 8:13 AM

## 2016-08-30 NOTE — Progress Notes (Signed)
Pharmacy Antibiotic Note  Madeline FusiKathryn Kelly is a 36 y.o. female admitted on 09/08/2016 with meningitis.  Pharmacy has been consulted for vancomycin dosing.  Today, vancomycin trough 10 mcg/ml on 1g q8h.  Per ID, continuing vancomycin with ceftriaxone until culture sensitivities result.  Streptococcus pneumoniae identified in CSF and blood.  Plan: Adjust vancomycin to 1500 mg IV q8h.  Check trough at new steady state tomorrow. F/u culture results.   Height: 5\' 6"  (167.6 cm) Weight: 157 lb 13.6 oz (71.6 kg) IBW/kg (Calculated) : 59.3  Temp (24hrs), Avg:98.6 F (37 C), Min:97.3 F (36.3 C), Max:99.2 F (37.3 C)   Recent Labs Lab 29-Jan-2017 0306 29-Jan-2017 0419 29-Jan-2017 0954 29-Jan-2017 1338 08/29/16 0339 08/30/16 0510  WBC 9.6  --   --   --  16.9*  --   CREATININE 0.73  --   --   --  0.48 0.54  LATICACIDVEN  --  2.36* 3.5* 3.1*  --   --   VANCOTROUGH  --   --   --   --   --  10*    Estimated Creatinine Clearance: 99.5 mL/min (by C-G formula based on SCr of 0.54 mg/dL).    No Known Allergies  Antimicrobials this admission:  2/5 Vanc >>  2/5 Ceftriaxone >>   Dose adjustments this admission:  2/7: 0500 VT = 10 on 1 gram q8h after 6th dose  Microbiology results:  2/5 BCx: Strep pneumoniae in 2 of 2 sets; susceptibilities pending 2/5 BCID: Strep pneumoniae - called to Dr Orvan Falconerampbell (evening); continued present abx for now. 2/5 CSF cx: IP (GS = GPC in pairs) 2/5 MRSA PCR screen: negative  Thank you for allowing pharmacy to be a part of this patient's care.  Clance BollRunyon, Soriyah Osberg 08/30/2016 7:04 AM

## 2016-08-31 ENCOUNTER — Inpatient Hospital Stay (HOSPITAL_COMMUNITY): Payer: 59 | Admitting: Certified Registered"

## 2016-08-31 ENCOUNTER — Inpatient Hospital Stay (HOSPITAL_COMMUNITY): Payer: 59

## 2016-08-31 DIAGNOSIS — I619 Nontraumatic intracerebral hemorrhage, unspecified: Secondary | ICD-10-CM

## 2016-08-31 DIAGNOSIS — J9601 Acute respiratory failure with hypoxia: Secondary | ICD-10-CM

## 2016-08-31 DIAGNOSIS — G9340 Encephalopathy, unspecified: Secondary | ICD-10-CM

## 2016-08-31 DIAGNOSIS — R112 Nausea with vomiting, unspecified: Secondary | ICD-10-CM

## 2016-08-31 DIAGNOSIS — R29898 Other symptoms and signs involving the musculoskeletal system: Secondary | ICD-10-CM

## 2016-08-31 DIAGNOSIS — R42 Dizziness and giddiness: Secondary | ICD-10-CM

## 2016-08-31 LAB — CBC
HCT: 41.5 % (ref 36.0–46.0)
Hemoglobin: 14.8 g/dL (ref 12.0–15.0)
MCH: 27.1 pg (ref 26.0–34.0)
MCHC: 35.7 g/dL (ref 30.0–36.0)
MCV: 75.9 fL — ABNORMAL LOW (ref 78.0–100.0)
PLATELETS: 396 10*3/uL (ref 150–400)
RBC: 5.47 MIL/uL — ABNORMAL HIGH (ref 3.87–5.11)
RDW: 14.9 % (ref 11.5–15.5)
WBC: 9.6 10*3/uL (ref 4.0–10.5)

## 2016-08-31 LAB — BASIC METABOLIC PANEL
ANION GAP: 14 (ref 5–15)
BUN: 10 mg/dL (ref 6–20)
CALCIUM: 8.1 mg/dL — AB (ref 8.9–10.3)
CO2: 23 mmol/L (ref 22–32)
Chloride: 97 mmol/L — ABNORMAL LOW (ref 101–111)
Creatinine, Ser: 0.52 mg/dL (ref 0.44–1.00)
GFR calc Af Amer: >60 mL/min (ref >60)
GLUCOSE: 200 mg/dL — AB (ref 65–99)
Potassium: 2.8 mmol/L — ABNORMAL LOW (ref 3.5–5.1)
Sodium: 134 mmol/L — ABNORMAL LOW (ref 135–145)

## 2016-08-31 LAB — GLUCOSE, CAPILLARY
GLUCOSE-CAPILLARY: 187 mg/dL — AB (ref 65–99)
GLUCOSE-CAPILLARY: 233 mg/dL — AB (ref 65–99)
Glucose-Capillary: 158 mg/dL — ABNORMAL HIGH (ref 65–99)
Glucose-Capillary: 153 mg/dL — ABNORMAL HIGH (ref 65–99)

## 2016-08-31 LAB — CREATININE, SERUM
CREATININE: 0.54 mg/dL (ref 0.44–1.00)
GFR calc Af Amer: >60 mL/min (ref >60)

## 2016-08-31 LAB — BLOOD GAS, ARTERIAL
ACID-BASE EXCESS: 3.8 mmol/L — AB (ref 0.0–2.0)
Acid-Base Excess: 1.2 mmol/L (ref 0.0–2.0)
Bicarbonate: 24.1 mmol/L (ref 20.0–28.0)
Bicarbonate: 25.1 mmol/L (ref 20.0–28.0)
DRAWN BY: 11249
DRAWN BY: 404151
FIO2: 100
FIO2: 40
MECHVT: 510 mL
MECHVT: 510 mL
O2 Saturation: 100 %
O2 Saturation: 99.1 %
PEEP/CPAP: 5 cmH2O
PEEP: 5 cmH2O
PO2 ART: 206 mmHg — AB (ref 83.0–108.0)
Patient temperature: 97.5
Patient temperature: 98.6
RATE: 20 resp/min
RATE: 10 resp/min
pCO2 arterial: 23.7 mmHg — ABNORMAL LOW (ref 32.0–48.0)
pCO2 arterial: 38.7 mmHg (ref 32.0–48.0)
pH, Arterial: 7.609 (ref 7.350–7.450)
pH, Arterial: 7.429 (ref 7.350–7.450)
pO2, Arterial: 495 mmHg — ABNORMAL HIGH (ref 83.0–108.0)

## 2016-08-31 LAB — CSF CULTURE

## 2016-08-31 LAB — TRIGLYCERIDES: TRIGLYCERIDES: 192 mg/dL — AB (ref <150)

## 2016-08-31 LAB — CSF CULTURE W GRAM STAIN

## 2016-08-31 MED ORDER — SODIUM CHLORIDE 0.9 % IV SOLN: 250.0000 mL | INTRAVENOUS | Status: DC | PRN

## 2016-08-31 MED ORDER — EPINEPHRINE PF 1 MG/10ML IJ SOSY
PREFILLED_SYRINGE | INTRAMUSCULAR | Status: AC
  Filled 2016-08-31: qty 20

## 2016-08-31 MED ORDER — METOPROLOL TARTRATE 5 MG/5ML IV SOLN
2.5000 mg | INTRAVENOUS | Status: DC | PRN
  Administered 2016-08-31 (×2): 2.5 mg via INTRAVENOUS
  Filled 2016-08-31: qty 5

## 2016-08-31 MED ORDER — ACETAMINOPHEN 160 MG/5ML PO SOLN: 650.0000 mg | Freq: Four times a day (QID) | ORAL | Status: DC | PRN

## 2016-08-31 MED ORDER — FENTANYL CITRATE (PF) 100 MCG/2ML IJ SOLN: 100.0000 ug | INTRAMUSCULAR | Status: DC | PRN

## 2016-08-31 MED ORDER — PROMETHAZINE HCL 25 MG/ML IJ SOLN
12.5000 mg | Freq: Four times a day (QID) | INTRAMUSCULAR | Status: DC | PRN
  Administered 2016-08-31: 12.5 mg via INTRAVENOUS
  Filled 2016-08-31: qty 1

## 2016-08-31 MED ORDER — SACCHAROMYCES BOULARDII 250 MG PO CAPS
250.0000 mg | ORAL_CAPSULE | Freq: Two times a day (BID) | ORAL | Status: DC
  Administered 2016-09-01: 250 mg via ORAL
  Filled 2016-08-31 (×4): qty 1

## 2016-08-31 MED ORDER — PROPOFOL 10 MG/ML IV BOLUS
INTRAVENOUS | Status: AC
  Filled 2016-08-31: qty 20

## 2016-08-31 MED ORDER — CHLORHEXIDINE GLUCONATE 0.12% ORAL RINSE (MEDLINE KIT)
15.0000 mL | Freq: Two times a day (BID) | OROMUCOSAL | Status: DC
  Administered 2016-09-01: 15 mL via OROMUCOSAL

## 2016-08-31 MED ORDER — PROPOFOL 1000 MG/100ML IV EMUL
5.0000 ug/kg/min | INTRAVENOUS | Status: DC
  Administered 2016-08-31: 30 ug/kg/min via INTRAVENOUS
  Filled 2016-08-31: qty 100

## 2016-08-31 MED ORDER — INSULIN ASPART 100 UNIT/ML ~~LOC~~ SOLN
2.0000 [IU] | SUBCUTANEOUS | Status: DC
  Administered 2016-09-01: 6 [IU] via SUBCUTANEOUS
  Administered 2016-09-01: 4 [IU] via SUBCUTANEOUS

## 2016-08-31 MED ORDER — PROMETHAZINE HCL 25 MG PO TABS
25.0000 mg | ORAL_TABLET | Freq: Four times a day (QID) | ORAL | Status: DC | PRN
  Administered 2016-08-31: 25 mg via ORAL
  Filled 2016-08-31: qty 1

## 2016-08-31 MED ORDER — FENTANYL CITRATE (PF) 100 MCG/2ML IJ SOLN
INTRAMUSCULAR | Status: AC
  Filled 2016-08-31: qty 2

## 2016-08-31 MED ORDER — PANTOPRAZOLE SODIUM 40 MG IV SOLR
40.0000 mg | Freq: Every day | INTRAVENOUS | Status: DC
  Administered 2016-09-01: 40 mg via INTRAVENOUS
  Filled 2016-08-31: qty 40

## 2016-08-31 MED ORDER — ORAL CARE MOUTH RINSE
15.0000 mL | Freq: Four times a day (QID) | OROMUCOSAL | Status: DC
  Administered 2016-09-01 (×2): 15 mL via OROMUCOSAL

## 2016-08-31 MED ORDER — NICARDIPINE HCL IN NACL 20-0.86 MG/200ML-% IV SOLN
3.0000 mg/h | INTRAVENOUS | Status: DC
  Administered 2016-08-31: 5 mg/h via INTRAVENOUS
  Administered 2016-08-31: 12.5 mg/h via INTRAVENOUS
  Filled 2016-08-31 (×3): qty 200

## 2016-08-31 MED ORDER — PROPOFOL 10 MG/ML IV BOLUS
INTRAVENOUS | Status: DC | PRN
  Administered 2016-08-31: 160 mg via INTRAVENOUS

## 2016-08-31 MED ORDER — FENTANYL CITRATE (PF) 100 MCG/2ML IJ SOLN
100.0000 ug | INTRAMUSCULAR | Status: DC | PRN
  Administered 2016-08-31: 100 ug via INTRAVENOUS

## 2016-08-31 MED ORDER — FAMOTIDINE IN NACL 20-0.9 MG/50ML-% IV SOLN
20.0000 mg | Freq: Two times a day (BID) | INTRAVENOUS | Status: DC
  Administered 2016-09-01 (×2): 20 mg via INTRAVENOUS
  Filled 2016-08-31 (×2): qty 50

## 2016-08-31 MED ORDER — METOPROLOL TARTRATE 5 MG/5ML IV SOLN
INTRAVENOUS | Status: AC
  Filled 2016-08-31: qty 5

## 2016-08-31 NOTE — Progress Notes (Signed)
RT pulled ETT back 2 cm per conversation with Dr. Kendrick FriesMcQuaid and adjusted vent settings per order.  Bilateral breath sounds auscultated after the moving the ETT.

## 2016-08-31 NOTE — Progress Notes (Signed)
   08/31/16 2000  Clinical Encounter Type  Visited With Patient and family together;Health care provider  Visit Type Initial;Psychological support;Spiritual support  Referral From Nurse  Consult/Referral To Chaplain  Spiritual Encounters  Spiritual Needs Emotional  Stress Factors  Patient Stress Factors None identified  Family Stress Factors Family relationships;Health changes;Lack of knowledge    [facility] Family wished to have chaplain available upon arrival. Pt immediately taken to Ct. Chaplain escorted family to [facility] and provided ministry of presence, prayer and emotional support. Notified Licensed conveyancerunit secretary that family is in waiting area.

## 2016-08-31 NOTE — Progress Notes (Addendum)
Called to bedside after acute change of mental status at approximately 17:30. Pt recently transferred to [facility] from [facility]U last known well at 5:30pm when she developed left sided weakness. She's also got obvious left facial droop and diminished alertness with right gaze preference. 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   - For concern of airway protection, CCM consulted for intubation - [facility], Dr. Marchelle Gearingamaswamy accepting physician. - Code stroke called. Dr. Grace Isaacoqeer, neurology, called who discussed with neurosurgery. 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  - Cardene gtt ordered for SBP < 140 mmHg.   Madeline Junkeryan Jaelynn Pozo, MD 08/31/2016 6:37 PM

## 2016-08-31 NOTE — Progress Notes (Signed)
RT assisted with PT intubation. Vent setting on Flowsheet. Intubation resulted in postitve end tidal, bilateral breath sounds. PT is now on vent. RN bedside.

## 2016-08-31 NOTE — Progress Notes (Signed)
Patient arrived to unit at around 1710. Alert and oriented x 4. Quiet, still c/o nausea, had Phernergan 12.5 mg IV earlier given in ICU. Oriented to room and environment. Placed comfortably in bed.Will continue to monitor.

## 2016-08-31 NOTE — Anesthesia Procedure Notes (Signed)
Procedure Name: Intubation Date/Time: 08/31/2016 6:44 PM Performed by: Enriqueta ShutterWILLIFORD, Elson Ulbrich D Pre-anesthesia Checklist: Patient identified, Emergency Drugs available, Suction available and Patient being monitored Patient Re-evaluated:Patient Re-evaluated prior to inductionOxygen Delivery Method: Circle system utilized Preoxygenation: Pre-oxygenation with 100% oxygen Intubation Type: IV induction, Cricoid Pressure applied and Rapid sequence Grade View: Grade I Tube type: Subglottic suction tube Tube size: 7.0 mm Number of attempts: 1 Airway Equipment and Method: Stylet Placement Confirmation: ETT inserted through vocal cords under direct vision,  positive ETCO2 and breath sounds checked- equal and bilateral Secured at: 22 cm Tube secured with: Tape Dental Injury: Teeth and Oropharynx as per pre-operative assessment  Comments: Called to  1237 for code stroke intubation

## 2016-08-31 NOTE — Care Management Note (Addendum)
Case Management Note  Patient Details  Name: Paulina FusiKathryn Cheong MRN: [id number] Date of Birth: 05-14-1981  Subjective/Objective:    Transferring from sdu to medical floor.      Has been inpatient for bacteriemia.          Action/Plan:Date:  August 31, 2016 Chart reviewed for concurrent status and case management needs. Will continue to follow patient progress. Discharge Planning: following for needs Expected discharge date: [id number] Marcelle SmilingRhonda Davis, BSN, [place], [place]   [phone]   Expected Discharge Date:   (unknown)               Expected Discharge Plan:  Home/Self Care  In-House Referral:     Discharge planning Services     Post Acute Care Choice:    Choice offered to:     DME Arranged:    DME Agency:     HH Arranged:    HH Agency:     Status of Service:  In process, will continue to follow  If discussed at Long Length of Stay Meetings, dates discussed:    Additional Comments:  Golda AcreDavis, Rhonda Lynn, RN 08/31/2016, 12:09 PM

## 2016-08-31 NOTE — Consult Note (Signed)
Reason for Consult:Intraventricular hemorrhage Referring Physician: [name] is an 36 y.o. female.  HPI: admitted for meningitis, today with acute neurologic worsening. Head Ct showed right basal ganglia hemorrhage with an intraventricular hemorrhage.   Past Medical History:  Diagnosis Date  . Asthma   . Migraines     Past Surgical History:  Procedure Laterality Date  . FOOT SURGERY    . TUBAL LIGATION      Family History  Problem Relation Age of Onset  . Cancer Other     Social History:  reports that she has never smoked. She has never used smokeless tobacco. She reports that she drinks alcohol. She reports that she does not use drugs.  Allergies: No Known Allergies  Medications: I have reviewed the patient's current medications.  Results for orders placed or performed during the hospital encounter of 09/16/2016 (from the past 48 hour(s))  Magnesium in AM     Status: None   Collection Time: 08/30/16  5:10 AM  Result Value Ref Range   Magnesium 2.3 1.7 - 2.4 mg/dL  BMET in AM     Status: Abnormal   Collection Time: 08/30/16  5:10 AM  Result Value Ref Range   Sodium 139 135 - 145 mmol/L   Potassium 3.5 3.5 - 5.1 mmol/L   Chloride 106 101 - 111 mmol/L   CO2 24 22 - 32 mmol/L   Glucose, Bld 202 (H) 65 - 99 mg/dL   BUN 13 6 - 20 mg/dL   Creatinine, Ser 0.54 0.44 - 1.00 mg/dL   Calcium 8.0 (L) 8.9 - 10.3 mg/dL   GFR calc non Af Amer >60 >60 mL/min   GFR calc Af Amer >60 >60 mL/min    Comment: (NOTE) The eGFR has been calculated using the CKD EPI equation. This calculation has not been validated in all clinical situations. eGFR's persistently <60 mL/min signify possible Chronic Kidney Disease.    Anion gap 9 5 - 15  Vancomycin, trough     Status: Abnormal   Collection Time: 08/30/16  5:10 AM  Result Value Ref Range   Vancomycin Tr 10 (L) 15 - 20 ug/mL  Glucose, capillary     Status: Abnormal   Collection Time: 08/30/16  7:55 AM  Result Value Ref Range    Glucose-Capillary 211 (H) 65 - 99 mg/dL  Glucose, capillary     Status: Abnormal   Collection Time: 08/30/16 11:49 AM  Result Value Ref Range   Glucose-Capillary 164 (H) 65 - 99 mg/dL  Glucose, capillary     Status: Abnormal   Collection Time: 08/30/16  5:13 PM  Result Value Ref Range   Glucose-Capillary 197 (H) 65 - 99 mg/dL  Glucose, capillary     Status: Abnormal   Collection Time: 08/30/16  9:11 PM  Result Value Ref Range   Glucose-Capillary 149 (H) 65 - 99 mg/dL  Creatinine, serum     Status: None   Collection Time: 08/31/16  3:31 AM  Result Value Ref Range   Creatinine, Ser 0.54 0.44 - 1.00 mg/dL   GFR calc non Af Amer >60 >60 mL/min   GFR calc Af Amer >60 >60 mL/min    Comment: (NOTE) The eGFR has been calculated using the CKD EPI equation. This calculation has not been validated in all clinical situations. eGFR's persistently <60 mL/min signify possible Chronic Kidney Disease.   Glucose, capillary     Status: Abnormal   Collection Time: 08/31/16  6:59 AM  Result Value Ref  Range   Glucose-Capillary 187 (H) 65 - 99 mg/dL  Glucose, capillary     Status: Abnormal   Collection Time: 08/31/16  8:41 AM  Result Value Ref Range   Glucose-Capillary 233 (H) 65 - 99 mg/dL   Comment 1 Notify RN    Comment 2 Document in Chart   Glucose, capillary     Status: Abnormal   Collection Time: 08/31/16 11:39 AM  Result Value Ref Range   Glucose-Capillary 158 (H) 65 - 99 mg/dL   Comment 1 Notify RN    Comment 2 Document in Chart   Glucose, capillary     Status: Abnormal   Collection Time: 08/31/16  5:48 PM  Result Value Ref Range   Glucose-Capillary 153 (H) 65 - 99 mg/dL  CBC     Status: Abnormal   Collection Time: 08/31/16  7:23 PM  Result Value Ref Range   WBC 9.6 4.0 - 10.5 K/uL   RBC 5.47 (H) 3.87 - 5.11 MIL/uL   Hemoglobin 14.8 12.0 - 15.0 g/dL   HCT 41.5 36.0 - 46.0 %   MCV 75.9 (L) 78.0 - 100.0 fL   MCH 27.1 26.0 - 34.0 pg   MCHC 35.7 30.0 - 36.0 g/dL   RDW 14.9 11.5  - 15.5 %   Platelets 396 150 - 400 K/uL  Basic metabolic panel     Status: Abnormal   Collection Time: 08/31/16  7:23 PM  Result Value Ref Range   Sodium 134 (L) 135 - 145 mmol/L   Potassium 2.8 (L) 3.5 - 5.1 mmol/L    Comment: DELTA CHECK NOTED REPEATED TO VERIFY    Chloride 97 (L) 101 - 111 mmol/L   CO2 23 22 - 32 mmol/L   Glucose, Bld 200 (H) 65 - 99 mg/dL   BUN 10 6 - 20 mg/dL   Creatinine, Ser 0.52 0.44 - 1.00 mg/dL   Calcium 8.1 (L) 8.9 - 10.3 mg/dL   GFR calc non Af Amer >60 >60 mL/min   GFR calc Af Amer >60 >60 mL/min    Comment: (NOTE) The eGFR has been calculated using the CKD EPI equation. This calculation has not been validated in all clinical situations. eGFR's persistently <60 mL/min signify possible Chronic Kidney Disease.    Anion gap 14 5 - 15  Blood gas, arterial     Status: Abnormal   Collection Time: 08/31/16  8:00 PM  Result Value Ref Range   FIO2 100.00    Delivery systems VENTILATOR    Mode PRESSURE REGULATED VOLUME CONTROL    VT 510 mL   LHR 20 resp/min   Peep/cpap 5.0 cm H20   pH, Arterial 7.609 (HH) 7.350 - 7.450    Comment: CRITICAL RESULT CALLED TO, READ BACK BY AND VERIFIED WITH: CHERYL DENNY,RN AT 1929 BY AMY RAY,RRT,RCP ON 08/31/2016    pCO2 arterial 23.7 (L) 32.0 - 48.0 mmHg   pO2, Arterial 495 (H) 83.0 - 108.0 mmHg   Bicarbonate 24.1 20.0 - 28.0 mmol/L   Acid-Base Excess 3.8 (H) 0.0 - 2.0 mmol/L   O2 Saturation 100.0 %   Patient temperature 97.5    Collection site LEFT RADIAL    Drawn by [id number]    Sample type ARTERIAL DRAW    Allens test (pass/fail) PASS PASS    Dg Abd 1 View  Result Date: 08/31/2016 CLINICAL DATA:  NG tube placement EXAM: ABDOMEN - 1 VIEW COMPARISON:  04/23/2014. FINDINGS: 1904 hours. NG tube tip is in the distal stomach. Bowel  gas pattern is nonspecific. IMPRESSION: NG tube tip is positioned in the distal stomach. Electronically Signed   By: Misty Stanley M.D.   On: 08/31/2016 19:27   Ct Head Wo Contrast  Result  Date: 08/31/2016 CLINICAL DATA:  Code stroke EXAM: CT HEAD WITHOUT CONTRAST TECHNIQUE: Contiguous axial images were obtained from the base of the skull through the vertex without intravenous contrast. COMPARISON:  None. FINDINGS: Brain: High-density acute intracranial hemorrhage centered within the RIGHT basal ganglia measuring 2.9 x 3.8 x 2.4 cm (volume = 14 cm^3). Hemorrhage extends into the adjacent lateral ventricle. There is mass effect from the acute intracranial hemorrhage with 5 mm of leftward midline shift. The ventricular hemorrhage extends into the third ventricle and fourth ventricle. Mild subarachnoid hemorrhage in the RIGHT middle cranial fossa No acute infarction identified.  Findings new from CT 09/02/2016 Vascular: No hyperdense vessel or unexpected calcification. Skull: Normal. Negative for fracture or focal lesion. Sinuses/Orbits: Paranasal sinuses and mastoid air cells are clear. Orbits are clear. Other: None. IMPRESSION: 1. Acute intracranial hemorrhage centered within the RIGHT basal ganglia with approximate volume of 14 cubic cm. 2. Mild subarachnoid hemorrhage in the RIGHT middle cranial fossa. 3. Mass effect with 5 mm of leftward midline shift. 4. Acute hemorrhage extends into the ventricular system involving the lateral ventricles, third ventricle and filling the fourth ventricle. Critical Value/emergent results were called by telephone at the time of interpretation on 08/31/2016 at 6:30 pm to Dr. Vance Gather , who verbally acknowledged these results. Electronically Signed   By: Suzy Bouchard M.D.   On: 08/31/2016 18:33   Dg Chest Port 1 View  Result Date: 08/31/2016 CLINICAL DATA:  Endotracheal tube placement EXAM: PORTABLE CHEST 1 VIEW COMPARISON:  08/29/2016 FINDINGS: 1904 hours. Endotracheal tube tip is just into the right mainstem bronchus and could be pulled back 2-3 cm for more appropriate positioning. Lungs are clear. The NG tube passes into the stomach although the distal tip  position is not included on the film. The visualized bony structures of the thorax are intact. Telemetry leads overlie the chest. IMPRESSION: Endotracheal tube tip is directed just barely into the right mainstem bronchus. Tube could be pulled back 2-3 cm for more appropriate positioning. I personally discussed the results of this study with the respiratory therapist, Amy , at 1932 hours on 08/31/2016. Electronically Signed   By: Misty Stanley M.D.   On: 08/31/2016 19:32   Ct Head Code Stroke Wo Contrast  Result Date: 08/31/2016 CLINICAL DATA:  Code stroke.  Hemorrhagic stroke. EXAM: CT HEAD WITHOUT CONTRAST TECHNIQUE: Contiguous axial images were obtained from the base of the skull through the vertex without intravenous contrast. COMPARISON:  CT HEAD August 31, 2016 at 1801 hours FINDINGS: BRAIN: Evolving 3.9 x 2.8 cm RIGHT basal ganglia hematoma, relatively unchanged in size. Intraventricular extension with blood products throughout all ventricles. 7 mm RIGHT to LEFT midline shift and mild hydrocephalus new from prior CT. No acute large vascular territory infarcts. Similar linear density RIGHT sylvian fissure. Effaced basal cisterns. VASCULAR: Unremarkable. SKULL/SOFT TISSUES: No skull fracture. No significant soft tissue swelling. ORBITS/SINUSES: The included ocular globes and orbital contents are normal.RIGHT mastoid effusion. Paranasal sinuses are well-aerated. OTHER: Tubing in the oral cavity partially imaged. IMPRESSION: Evolving 3.9 x 2.8 cm RIGHT basal ganglia hematoma with intraventricular extension 7 mm RIGHT to LEFT midline shift and new mild hydrocephalus. Dense RIGHT sylvian fissure, which can be seen with subarachnoid hemorrhage/redistributed blood products, thromboembolism or vascular congestion. No acute large  vascular territory infarcts. Acute findings discussed with and reconfirmed by Dr.Lindzen, Neurology on 08/31/2016 at 8:50 pm. Electronically Signed   By: Elon Alas M.D.   On:  08/31/2016 20:52    Review of Systems  Unable to perform ROS: Patient unresponsive   Blood pressure (!) 159/99, pulse (!) 133, temperature 97.4 F (36.3 C), temperature source Axillary, resp. rate 14, height 5' 6"  (1.676 m), weight 69.8 kg (153 lb 14.1 oz), SpO2 100 %. Physical Exam  Constitutional: She appears well-developed and well-nourished.  HENT:  Head: Normocephalic.  Eyes:  Pupils dilated 11m, not reactive  Cardiovascular: Normal rate.   Neurological: She is unresponsive. A cranial nerve deficit is present.  Comatose, not responsive No cough, no gag No response to noxious stimuli No corneals    Assessment/Plan: I along with Dr. LCharna Archerfrom neurology spoke with the family. Madeline Kelly and dramatic decline does not warrant further what I believe to be futile treatment. I have explained my rationale to the family. Her spouse voiced understanding. She is currently showing no evidence of cerebral function.  Madeline Kelly L 08/31/2016, 9:18 PM

## 2016-08-31 NOTE — Progress Notes (Signed)
PROGRESS NOTE  Madeline Kelly  AVW:[id number] DOB: 08-Dec-1980 DOA: 09/07/2016 PCP: No PCP Per Patient Outpatient Specialists:  None  Brief Narrative:  Madeline Kelly is a 36 year old female with a PMH of migraines and asthma who presented with confusion and garbled speech following a few days of sinus congestion, headache, and cough. She was subsequently found to have pneumococcal meningitis and bacteremia. She was intubated from 2/5-2/6 for airway protection and was transferred out of the ICU on 2/7.  Assessment & Plan:   Principal Problem:   Pneumococcal meningitis Active Problems:   Asthma   Sinusitis   Mastoiditis   Pneumococcal bacteremia  Pneumococcal Meningitis and Bacteremia: Possibly due to sinusitis/right mastoiditis. Mentation has returned to normal. No vegetations on echo.  - Susceptibilities returned, can DC vancomycin. Per ID, will need 10-14 days IV abx followed by several weeks of po abx to clear sinusitis/mastoiditis.  - Insert PICC for prolonged IV antibiotics.  - Continue IV Decadron 10mg  q6hrs (Day 4/4) - Pt is significantly weak, will order PT and encourage ambulation with assistance.  - May require audiology evaluation as outpatient given her possible decreased hearing.  Hyperglycemia: Likely worsened with Decadron, and important to maintain good control.  - Start sensitive SSI - CBGs with meals  Hypertension: Not on any medications for this, so wonder how much contribution of steroids.  - Monitor   Asthma: Lungs clear. - Albuterol prn  DVT prophylaxis: Heparin Code Status: Full Family Communication: Husband present at bedside, updated on plan. Disposition Plan: Continue med-surg to complete IV decadron. PICC insertion and will get home health for IV antibiotics.   Consultants:   Infectious disease  Pulmonary, Critical care  Procedures:   Endotracheal intubation (2/5-2/6)  ECHO (2/6)- No evidence of vegetation  Antimicrobials:   Vancomycin (2/5 -  2/8)  Ceftriaxone (2/5 - x14 days)  Subjective: Pt still intermittently nauseated but ate well yesterday. Mentation normal per husband, but he is concerned that she did not realize a heart monitor alarm was going off, wondering if her hearing is affected.  Objective: Vitals:   08/31/16 0620 08/31/16 0849 08/31/16 1235 08/31/16 1242  BP: (!) 152/86 (!) 167/87  (!) 178/97  Pulse:  (!) 55  61  Resp: 12 16    Temp:  98.4 F (36.9 C) 97.8 F (36.6 C)   TempSrc:  Oral Oral   SpO2: 100% 100%  98%  Weight:      Height:        Intake/Output Summary (Last 24 hours) at 08/31/16 1458 Last data filed at 08/31/16 [id number]  Gross per 24 hour  Intake             1340 ml  Output                0 ml  Net             1340 ml   Filed Weights   08/29/16 0452 08/30/16 0500 08/31/16 0359  Weight: 70.7 kg (155 lb 13.8 oz) 71.6 kg (157 lb 13.6 oz) 69.8 kg (153 lb 14.1 oz)   General exam: Tired-appearing but alert, answering questions appropriately Respiratory system: Clear to auscultation. Respiratory effort normal. Cardiovascular system: S1 & S2 heard, RRR. No JVD, murmurs, rubs, gallops or clicks. No pedal edema. Gastrointestinal system: Abdomen is nondistended, soft and nontender. No organomegaly or masses felt. Normal bowel sounds heard. Central nervous system: Alert and oriented x 3. No focal neurological deficits. No gross deficit in hearing.  Extremities:  Moves all extremities spontaneously Skin: No rashes, lesions or ulcers Psychiatry: Judgement and insight appear normal. Mood & affect appropriate.   Data Reviewed: I have personally reviewed following labs and imaging studies  CBC:  Recent Labs Lab 09/09/16 0306 08/29/16 0339  WBC 9.6 16.9*  NEUTROABS 7.6  --   HGB 12.8 11.3*  HCT 37.5 33.1*  MCV 77.5* 78.3  PLT 214 227   Basic Metabolic Panel:  Recent Labs Lab 2016/09/09 0306 08/29/16 0339 08/30/16 0510 08/31/16 0331  NA 132* 140 139  --   K 3.3* 3.5 3.5  --   CL 99* 109  106  --   CO2 25 24 24   --   GLUCOSE 183* 113* 202*  --   BUN 8 7 13   --   CREATININE 0.73 0.48 0.54 0.54  CALCIUM 8.5* 7.3* 8.0*  --   MG  --  1.8 2.3  --   PHOS  --  2.4*  --   --    GFR: Estimated Creatinine Clearance: 91.9 mL/min (by C-G formula based on SCr of 0.54 mg/dL). Liver Function Tests:  Recent Labs Lab 2016/09/09 0306  AST 20  ALT 16  ALKPHOS 41  BILITOT 0.6  PROT 7.5  ALBUMIN 3.4*    Recent Labs Lab Sep 09, 2016 0306  LIPASE 15   No results for input(s): AMMONIA in the last 168 hours. Coagulation Profile:  Recent Labs Lab 09-Sep-2016 0954  INR 1.27   Cardiac Enzymes: No results for input(s): CKTOTAL, CKMB, CKMBINDEX, TROPONINI in the last 168 hours. BNP (last 3 results) No results for input(s): PROBNP in the last 8760 hours. HbA1C: No results for input(s): HGBA1C in the last 72 hours. CBG:  Recent Labs Lab 08/30/16 1713 08/30/16 2111 08/31/16 0659 08/31/16 0841 08/31/16 1139  GLUCAP 197* 149* 187* 233* 158*   Lipid Profile: No results for input(s): CHOL, HDL, LDLCALC, TRIG, CHOLHDL, LDLDIRECT in the last 72 hours. Thyroid Function Tests: No results for input(s): TSH, T4TOTAL, FREET4, T3FREE, THYROIDAB in the last 72 hours. Anemia Panel: No results for input(s): VITAMINB12, FOLATE, FERRITIN, TIBC, IRON, RETICCTPCT in the last 72 hours. Urine analysis:    Component Value Date/Time   COLORURINE YELLOW 04/23/2014 0129   APPEARANCEUR CLEAR 04/23/2014 0129   LABSPEC 1.022 04/23/2014 0129   PHURINE 6.0 04/23/2014 0129   GLUCOSEU NEGATIVE 04/23/2014 0129   HGBUR NEGATIVE 04/23/2014 0129   BILIRUBINUR NEGATIVE 04/23/2014 0129   KETONESUR NEGATIVE 04/23/2014 0129   PROTEINUR NEGATIVE 04/23/2014 0129   UROBILINOGEN 1.0 04/23/2014 0129   NITRITE NEGATIVE 04/23/2014 0129   LEUKOCYTESUR NEGATIVE 04/23/2014 0129   Sepsis Labs: @LABRCNTIP (procalcitonin:4,lacticidven:4)  ) Recent Results (from the past 240 hour(s))  Blood culture (routine x 2)      Status: Abnormal   Collection Time: Sep 09, 2016  3:53 AM  Result Value Ref Range Status   Specimen Description BLOOD LEFT FOREARM  Final   Special Requests BOTTLES DRAWN AEROBIC AND ANAEROBIC  Final   Culture  Setup Time   Final    GRAM POSITIVE COCCI IN PAIRS IN BOTH AEROBIC AND ANAEROBIC BOTTLES CRITICAL VALUE NOTED.  VALUE IS CONSISTENT WITH PREVIOUSLY REPORTED AND CALLED VALUE.    Culture (A)  Final    STREPTOCOCCUS PNEUMONIAE SUSCEPTIBILITIES PERFORMED ON PREVIOUS CULTURE WITHIN THE LAST 5 DAYS. Performed at [facility], [address], [place], [place] [address]    Report Status 08/30/2016 FINAL  Final  Blood culture (routine x 2)     Status: Abnormal  Collection Time: 09-08-2016  4:03 AM  Result Value Ref Range Status   Specimen Description BLOOD RIGHT FOREARM  Final   Special Requests BOTTLES DRAWN AEROBIC AND ANAEROBIC  Final   Culture  Setup Time   Final    GRAM POSITIVE COCCI IN PAIRS IN BOTH AEROBIC AND ANAEROBIC BOTTLES CRITICAL RESULT CALLED TO, READ BACK BY AND VERIFIED WITH: L. Otho Darner, 2016/09/08 AT 1934 BY J FUDESCO Performed at [facility], [address], [place], [place] [address]    Culture STREPTOCOCCUS PNEUMONIAE (A)  Final   Report Status 08/30/2016 FINAL  Final   Organism ID, Bacteria STREPTOCOCCUS PNEUMONIAE  Final      Susceptibility   Streptococcus pneumoniae - MIC*    ERYTHROMYCIN <=0.12 SENSITIVE Sensitive     LEVOFLOXACIN 0.5 SENSITIVE Sensitive     PENICILLIN <=0.06 SENSITIVE Sensitive     CEFTRIAXONE <=0.12 SENSITIVE Sensitive     * STREPTOCOCCUS PNEUMONIAE  Blood Culture ID Panel (Reflexed)     Status: Abnormal   Collection Time: September 08, 2016  4:03 AM  Result Value Ref Range Status   Enterococcus species NOT DETECTED NOT DETECTED Final   Listeria monocytogenes NOT DETECTED NOT DETECTED Final   Staphylococcus species NOT DETECTED NOT DETECTED Final   Staphylococcus aureus NOT DETECTED NOT DETECTED Final   Streptococcus  species DETECTED (A) NOT DETECTED Final   Streptococcus agalactiae NOT DETECTED NOT DETECTED Final   Streptococcus pneumoniae DETECTED (A) NOT DETECTED Final    Comment: CRITICAL RESULT CALLED TO, READ BACK BY AND VERIFIED WITH: C. BALL, PHARM, 08-Sep-2016 AT 2120 BY J FUDESCO    Streptococcus pyogenes NOT DETECTED NOT DETECTED Final   Acinetobacter baumannii NOT DETECTED NOT DETECTED Final   Enterobacteriaceae species NOT DETECTED NOT DETECTED Final   Enterobacter cloacae complex NOT DETECTED NOT DETECTED Final   Escherichia coli NOT DETECTED NOT DETECTED Final   Klebsiella oxytoca NOT DETECTED NOT DETECTED Final   Klebsiella pneumoniae NOT DETECTED NOT DETECTED Final   Proteus species NOT DETECTED NOT DETECTED Final   Serratia marcescens NOT DETECTED NOT DETECTED Final   Haemophilus influenzae NOT DETECTED NOT DETECTED Final   Neisseria meningitidis NOT DETECTED NOT DETECTED Final   Pseudomonas aeruginosa NOT DETECTED NOT DETECTED Final    Comment: CRITICAL RESULT CALLED TO, READ BACK BY AND VERIFIED WITH: C. BAL, PHARM, 2016-09-08 AT 2120 BY J FUDESCO    Candida albicans NOT DETECTED NOT DETECTED Final   Candida glabrata NOT DETECTED NOT DETECTED Final   Candida krusei NOT DETECTED NOT DETECTED Final   Candida parapsilosis NOT DETECTED NOT DETECTED Final   Candida tropicalis NOT DETECTED NOT DETECTED Final    Comment: Performed at [facility], [address] [place], [place] [address]  CSF culture     Status: None   Collection Time: 09/08/2016  4:35 AM  Result Value Ref Range Status   Specimen Description CSF  Final   Special Requests NONE  Final   Gram Stain   Final    WBC PRESENT, PREDOMINANTLY PMN GRAM POSITIVE COCCI IN PAIRS CYTOSPIN SMEAR Gram Stain Report Called to,Read Back By and Verified With: COGGINS,IRENE. RN @0545  ON September 08, 2016 BY [email or web address]    Culture MODERATE STREPTOCOCCUS PNEUMONIAE  Final   Report Status 08/31/2016 FINAL  Final   Organism ID, Bacteria STREPTOCOCCUS  PNEUMONIAE  Final      Susceptibility   Streptococcus pneumoniae - MIC*    ERYTHROMYCIN <=0.12 SENSITIVE Sensitive     LEVOFLOXACIN 1  SENSITIVE Sensitive     PENICILLIN <=0.06 SENSITIVE Sensitive     CEFTRIAXONE <=0.12 SENSITIVE Sensitive     * MODERATE STREPTOCOCCUS PNEUMONIAE  MRSA PCR Screening     Status: None   Collection Time: 08/26/2016  9:39 AM  Result Value Ref Range Status   MRSA by PCR NEGATIVE NEGATIVE Final    Comment:        The GeneXpert MRSA Assay (FDA approved for NASAL specimens only), is one component of a comprehensive MRSA colonization surveillance program. It is not intended to diagnose MRSA infection nor to guide or monitor treatment for MRSA infections.          Radiology Studies: No results found.   Scheduled Meds: . azelastine  2 spray Each Nare BID  . cefTRIAXone (ROCEPHIN)  IV  2 g Intravenous Q12H  . dexamethasone  10 mg Intravenous Q6H  . heparin  5,000 Units Subcutaneous Q8H  . insulin aspart  0-9 Units Subcutaneous TID WC   Continuous Infusions:   LOS: 3 days   Hazeline Junker, MD Pager # [phone]  If 7PM-7AM, please contact night-coverage [email or web address] Password [email or web address] 08/31/2016, 2:58 PM

## 2016-08-31 NOTE — Progress Notes (Signed)
At around 1745, husband called this nurse to room ASAP. Patient tried to get out of bed, noticed patient has L sided weakness. Per husband, patient can't lift her L hand,peech was garbled.Face flushed. BP taken. Rapid response nurse notified and also Dr. Jarvis NewcomerGrunz. Patient was taken to CT per MD order at round 1800. [facility] that patient will return to ICU because of change in patient's status, all questions answered appropriately.

## 2016-08-31 NOTE — Significant Event (Signed)
Rapid Response Event Note  Overview:      Initial Focused Assessment:   Interventions:  Plan of Care (if not transferred):  Event Summary: Called to 1322 for help. Upon arrival patient supine in the bed with husband at bedside, 2 RN's at bedside. It was reported to me that she had walked to the bathroom with her husband and returned to bed. Her husband stated upon return to bed she attempted to push the nurse call bell but could not coordinate the eye hand movement. He noticed a facial droop and called the nurse to come to her room. This RN then performed a neuro eval.  She could not grip with her left hand however the left hand was turning inward towards her body. She could raise both eyebrows symmetrically. Her smile was grossly asymmetrical with a left sided droop. Her tongue was symmetrical. Her left leg did not move upon request. Immediately this RN started NS wide open. MD arrived in room and assessed patient. Code Stroke was called. The patient was then transported  to CT scan. She was then transported to 1237.Her Pupils were 1 mm bilaterally one she arrived in room 1237. PCCM NP arrived with orders to call Anesthesia to intubate pending her increasing lethargy. Anesthesia was called and she was intubated.   at      [place]          [place]

## 2016-08-31 NOTE — Progress Notes (Signed)
PULMONARY / CRITICAL CARE MEDICINE   Name: Madeline FusiKathryn Meisinger MRN: [id number] DOB: July 25, 1980    ADMISSION DATE:  08/31/2016 CONSULTATION DATE:  08/31/2016  REFERRING MD:  Dr. Jarvis NewcomerGrunz  CHIEF COMPLAINT: ICH  HISTORY OF PRESENT ILLNESS:   36 year old female with PMH of Asthma and Migraines presents to ED on 2/5 found to have pneumococcal meningitis and bacteremia. Was extubated on 2/6 and transferred out of ICU on 2/7. On 2/8 patient had acute neuro changes with left sided weakness, garbled speech, and flushed face. Taken for STAT head CT with acute intracranial hemorrhage within the right basal ganglia with approximately 14cc of volume. PCCM called to consult.   SUBJECTIVE:  New ICH, hypertensive. Neurology at bedside.   VITAL SIGNS: BP (!) 156/86 (BP Location: Right Arm)   Pulse 61   Temp 97.8 F (36.6 C) (Oral)   Resp 16   Ht 5\' 6"  (1.676 m)   Wt 69.8 kg (153 lb 14.1 oz)   SpO2 98%   BMI 24.84 kg/m   HEMODYNAMICS:    VENTILATOR SETTINGS:    INTAKE / OUTPUT: I/O last 3 completed shifts: In: 3080 [P.O.:480; I.V.:750; IV Piggyback:1850] Out: -   PHYSICAL EXAMINATION: General: Adult female, flushed Neuro:  Lethargic, follows commands at times, moves right upper arm, left arm flaccid HEENT:  Normocephalic  Cardiovascular:  Tachy, no MRG, NI S1/S2 Lungs:  Clear, diminished, unlabored  Abdomen:  Non-tender, non-distended, active bowel sounds  Musculoskeletal:  No edema  Skin:  Warm, dry, intact   LABS:  BMET  Recent Labs Lab 09/07/2016 0306 08/29/16 0339 08/30/16 0510 08/31/16 0331  NA 132* 140 139  --   K 3.3* 3.5 3.5  --   CL 99* 109 106  --   CO2 25 24 24   --   BUN 8 7 13   --   CREATININE 0.73 0.48 0.54 0.54  GLUCOSE 183* 113* 202*  --     Electrolytes  Recent Labs Lab 08/25/2016 0306 08/29/16 0339 08/30/16 0510  CALCIUM 8.5* 7.3* 8.0*  MG  --  1.8 2.3  PHOS  --  2.4*  --     CBC  Recent Labs Lab 09/16/2016 0306 08/29/16 0339  WBC 9.6 16.9*  HGB  12.8 11.3*  HCT 37.5 33.1*  PLT 214 227    Coag's  Recent Labs Lab 09/07/2016 0954  APTT 32  INR 1.27    Sepsis Markers  Recent Labs Lab 09/03/2016 0305 09/09/2016 0419 09/19/2016 0954 09/09/2016 1338 08/29/16 0339  LATICACIDVEN  --  2.36* 3.5* 3.1*  --   PROCALCITON 0.21  --   --   --  3.96    ABG  Recent Labs Lab 09/12/2016 0745 08/29/16 0445  PHART 7.413 7.367  PCO2ART 32.4 43.6  PO2ART 548* 149*    Liver Enzymes  Recent Labs Lab 09/19/2016 0306  AST 20  ALT 16  ALKPHOS 41  BILITOT 0.6  ALBUMIN 3.4*    Cardiac Enzymes No results for input(s): TROPONINI, PROBNP in the last 168 hours.  Glucose  Recent Labs Lab 08/30/16 1713 08/30/16 2111 08/31/16 0659 08/31/16 0841 08/31/16 1139 08/31/16 1748  GLUCAP 197* 149* 187* 233* 158* 153*    Imaging No results found.   STUDIES:  CT Head 2/8 > acute intracranial hemorrhage within the right basal ganglia with approximately 14cc of volume  CULTURES: BCX2 2/5 > gpc > strep  Csf 2/5 > gpc > step  MRSA by PCR 2/5 >   ANTIBIOTICS: vanc 2/5 >  2/7 Rocephin 2/5 >  SIGNIFICANT EVENTS: 2/5 > Presents to ED > diagnosis with bacterial meningitis  2/8 > found new ICH   LINES/TUBES: oett 2/5 > 2/6 ETT 2/8 >    DISCUSSION: 36 year old female admitted with Bacterial Meningitis on 2/5, intubated for airway protection, extubated 2/6 and sent to floor on 2/7. On 2/8 experienced neuro changes, found new ICH on head CT.   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 protection H/O Asthma   P:   Intubate Now  Full vent support PAD protocol F/u CXR & abg Albuterol PRN  CARDIOVASCULAR A:  HTN P:  Cardiac Monitoring  Wean Cardene to Maintain Systolic <140   RENAL A:   Hypokalemia  P:   Trend BMP Replace electrolytes as needed   GASTROINTESTINAL A:   Dysphagia  P:   NPO TF per dietary  Speech therapy evaluation once extubated   HEMATOLOGIC A:    ICH P:  Hold all anticoagulation Trend CBC   INFECTIOUS A:   Acute Bacterial Meningitis  P:   Continue Ceftriaxone for 6 more days  Trend Fever and WBC curve   ENDOCRINE A:   Hyperglycemia  P:   SSI Q4H glucose checks   NEUROLOGIC A:   New ICH  Acute Encephalopathy in setting of bacterial meningitis  P:   Neurology following  Neurosurgery consulted  Fentanyl PRN, Wean propofol to achieve RASS  RASS goal: 0/-1   FAMILY  - Updates: Husband updated at bedside   - Inter-disciplinary family meet or Palliative Care meeting due by:  2/15   Jovita Kussmaul, AG-ACNP [facility]  Pgr: [phone]  PCCM Pgr: [phone]

## 2016-08-31 NOTE — Consult Note (Signed)
NEURO HOSPITALIST CONSULT NOTE   Requestig physician: Dr. Elsworth Soho  Reason for Consult: Acute right basal ganglia hemorrhage in the setting of pneumococcal meningitis  History obtained from:  Chart    HPI:                                                                                                                                          Madeline Kelly is an 36 y.o. female who was initially admitted with pneumocaccal meningitis and pneumococcal bacteremia. She was treated with vancomycin, ceftriaxone, and Decadron and ID was consulted. She required intubation for airway protection on 2/5 on admission; she was extubated on 2/6 and transferred to floor on 2/7. Earlier today she was doing well with no headache but some nausea, vomiting and dizziness. At approximately 1745 today, she acutely decompensated, with AMS and attempting to get out of bed. Nursing noted new facial droop, right gaze preference, left sided weakness and diminished alertness. A STAT CT was obtained, revealing a  massive right basal ganglia hemorrhage with extension into all ventricles. For concern of airway protection, she was emergently intubated and emergent transfer to [facility] was initiated. Prior to transfer, pupillary asymmetry was noted, right > left, with right pupil enlarging over serial exams. On arrival to [facility] STAT CT was obtained, revealing increased mass effect from the hemorrhage as well as interval increasing ventricular size, consistent with emerging hydrocephalus. STAT Neurosurgery and Neurological evaluations revealed bilateral dilated and unreactive pupils, with no corneal reflex, no oculovestibular reflex, no gag, no cough and flaccid extremities without response to noxious stimuli.   Past Medical History:  Diagnosis Date  . Asthma   . Migraines     Past Surgical History:  Procedure Laterality Date  . FOOT SURGERY    . TUBAL LIGATION      Family History  Problem Relation Age of Onset  .  Cancer Other    Social History:  reports that she has never smoked. She has never used smokeless tobacco. She reports that she drinks alcohol. She reports that she does not use drugs.  No Known Allergies  MEDICATIONS:  Current Facility-Administered Medications:  .  0.9 %  sodium chloride infusion, 250 mL, Intravenous, PRN, Erick Colace, NP .  0.9 %  sodium chloride infusion, 250 mL, Intravenous, PRN, Omar Person, NP .  acetaminophen (TYLENOL) solution 650 mg, 650 mg, Per Tube, Q6H PRN, Omar Person, NP .  albuterol (PROVENTIL) (2.5 MG/3ML) 0.083% nebulizer solution 2.5 mg, 2.5 mg, Inhalation, Q4H PRN, Kara Mead V, MD .  azelastine (ASTELIN) 0.1 % nasal spray 2 spray, 2 spray, Each Nare, BID, Erick Colace, NP, 2 spray at 08/31/16 [phone] .  cefTRIAXone (ROCEPHIN) 2 g in dextrose 5 % 50 mL IVPB, 2 g, Intravenous, Q12H, Erick Colace, NP, 2 g at 08/31/16 [phone] .  chlorhexidine gluconate (MEDLINE KIT) (PERIDEX) 0.12 % solution 15 mL, 15 mL, Mouth Rinse, BID, Juanito Doom, MD .  famotidine (PEPCID) IVPB 20 mg premix, 20 mg, Intravenous, Q12H, Juanito Doom, MD .  fentaNYL (SUBLIMAZE) 100 MCG/2ML injection, , , ,  .  fentaNYL (SUBLIMAZE) injection 100 mcg, 100 mcg, Intravenous, Q15 min PRN, Juanito Doom, MD .  fentaNYL (SUBLIMAZE) injection 100 mcg, 100 mcg, Intravenous, Q2H PRN, Juanito Doom, MD .  insulin aspart (novoLOG) injection 2-6 Units, 2-6 Units, Subcutaneous, Q4H, Omar Person, NP .  lip balm (CARMEX) ointment, , Topical, PRN, Erick Colace, NP .  Derrill Memo ON Sep 09, 2016] MEDLINE mouth rinse, 15 mL, Mouth Rinse, QID, Juanito Doom, MD .  metoprolol (LOPRESSOR) 5 MG/5ML injection, , , ,  .  metoprolol (LOPRESSOR) injection 2.5-5 mg, 2.5-5 mg, Intravenous, Q3H PRN, Brand Males, MD, 2.5 mg at 08/31/16 1959 .  nicardipine  (CARDENE) 99m in 0.86% saline 205mIV infusion (0.1 mg/ml), 3-15 mg/hr, Intravenous, Continuous, RyPatrecia PourMD, Last Rate: 100 mL/hr at 08/31/16 2009, 10 mg/hr at 08/31/16 2009 .  ondansetron (ZOFRAN) injection 4 mg, 4 mg, Intravenous, Q6H PRN, PeErick ColaceNP, 4 mg at 08/31/16 0900 .  pantoprazole (PROTONIX) injection 40 mg, 40 mg, Intravenous, QHS, KaOmar PersonNP .  promethazine (PHENERGAN) injection 12.5-25 mg, 12.5-25 mg, Intravenous, Q6H PRN, RyPatrecia PourMD, 12.5 mg at 08/31/16 1532 .  saccharomyces boulardii (FLORASTOR) capsule 250 mg, 250 mg, Oral, BID, RyPatrecia PourMD  ROS:                                                                                                                                       Unable to obtain due to coma.   Blood pressure (!) 88/53, pulse (!) 128, temperature 97.4 F (36.3 C), temperature source Axillary, resp. rate 10, height _0  (1.676 m), weight 69.8 kg (153 lb 14.1 oz), SpO2 100 %.  General Examination:  HEENT-  Normocephali/atraumatic. No meningismus noted.  Lungs- Intubated.  Extremities- Warm and well perfused.   Neurological Examination Mental Status: Comatose with GCS 3. No spontaneous movement. No volitional or reflexive responses to any stimuli.  Cranial Nerves: Pupils 7 mm bilaterally and unreactive with sustained light stimulation for 30 seconds. No corneal reflexes. No oculovestibular reflex. No cough or gag reflex. No blink to threat. No eye closure with bright light stimulation. Face flaccidly symmetric. No response to tactile stimulation of face. No response to auditory stimuli.  Motor/Sensory: Flaccid upper and lower extremities bilaterally without any movement to noxious stimuli.  Deep Tendon Reflexes: 2+ achilles bilaterally. 0 patellae, biceps and brachioradialis bilaterally. Toes mute.  Cerebellar/Gait: Unable to  assess.  Lab Results: Basic Metabolic Panel:  Recent Labs Lab 09/19/2016 0306 08/29/16 0339 08/30/16 0510 08/31/16 0331 08/31/16 1923  NA 132* 140 139  --  134*  K 3.3* 3.5 3.5  --  2.8*  CL 99* 109 106  --  97*  CO2 _0 --  23  GLUCOSE 183* 113* 202*  --  200*  BUN _1 --  10  CREATININE 0.73 0.48 0.54 0.54 0.52  CALCIUM 8.5* 7.3* 8.0*  --  8.1*  MG  --  1.8 2.3  --   --   PHOS  --  2.4*  --   --   --     Liver Function Tests:  Recent Labs Lab 08/24/2016 0306  AST 20  ALT 16  ALKPHOS 41  BILITOT 0.6  PROT 7.5  ALBUMIN 3.4*    Recent Labs Lab 09/20/2016 0306  LIPASE 15   No results for input(s): AMMONIA in the last 168 hours.  CBC:  Recent Labs Lab 09/16/2016 0306 08/29/16 0339 08/31/16 1923  WBC 9.6 16.9* 9.6  NEUTROABS 7.6  --   --   HGB 12.8 11.3* 14.8  HCT 37.5 33.1* 41.5  MCV 77.5* 78.3 75.9*  PLT 214 227 396    Cardiac Enzymes: No results for input(s): CKTOTAL, CKMB, CKMBINDEX, TROPONINI in the last 168 hours.  Lipid Panel:  Recent Labs Lab 08/25/2016 0306  TRIG 69    CBG:  Recent Labs Lab 08/30/16 2111 08/31/16 0659 08/31/16 0841 08/31/16 1139 08/31/16 1748  GLUCAP 149* 187* 233* 158* 153*    Microbiology: Results for orders placed or performed during the hospital encounter of 09/09/2016  Blood culture (routine x 2)     Status: Abnormal   Collection Time: 08/24/2016  3:53 AM  Result Value Ref Range Status   Specimen Description BLOOD LEFT FOREARM  Final   Special Requests BOTTLES DRAWN AEROBIC AND ANAEROBIC 5ML  Final   Culture  Setup Time   Final    GRAM POSITIVE COCCI IN PAIRS IN BOTH AEROBIC AND ANAEROBIC BOTTLES CRITICAL VALUE NOTED.  VALUE IS CONSISTENT WITH PREVIOUSLY REPORTED AND CALLED VALUE.    Culture (A)  Final    STREPTOCOCCUS PNEUMONIAE SUSCEPTIBILITIES PERFORMED ON PREVIOUS CULTURE WITHIN THE LAST 5 DAYS. Performed at [facility], [place] [address] [place], [place] [address]    Report Status  08/30/2016 FINAL  Final  Blood culture (routine x 2)     Status: Abnormal   Collection Time: 09/12/2016  4:03 AM  Result Value Ref Range Status   Specimen Description BLOOD RIGHT FOREARM  Final   Special Requests BOTTLES DRAWN AEROBIC AND ANAEROBIC 5ML  Final   Culture  Setup Time   Final    GRAM  POSITIVE COCCI IN PAIRS IN BOTH AEROBIC AND ANAEROBIC BOTTLES CRITICAL RESULT CALLED TO, READ BACK BY AND VERIFIED WITH: L. Omar Person, 09/13/2016 AT 1934 BY J FUDESCO Performed at [facility], [place] [address], [place], [place] [address]    Culture STREPTOCOCCUS PNEUMONIAE (A)  Final   Report Status 08/30/2016 FINAL  Final   Organism ID, Bacteria STREPTOCOCCUS PNEUMONIAE  Final      Susceptibility   Streptococcus pneumoniae - MIC*    ERYTHROMYCIN <=0.12 SENSITIVE Sensitive     LEVOFLOXACIN 0.5 SENSITIVE Sensitive     PENICILLIN <=0.06 SENSITIVE Sensitive     CEFTRIAXONE <=0.12 SENSITIVE Sensitive     * STREPTOCOCCUS PNEUMONIAE  Blood Culture ID Panel (Reflexed)     Status: Abnormal   Collection Time: 08/30/2016  4:03 AM  Result Value Ref Range Status   Enterococcus species NOT DETECTED NOT DETECTED Final   Listeria monocytogenes NOT DETECTED NOT DETECTED Final   Staphylococcus species NOT DETECTED NOT DETECTED Final   Staphylococcus aureus NOT DETECTED NOT DETECTED Final   Streptococcus species DETECTED (A) NOT DETECTED Final   Streptococcus agalactiae NOT DETECTED NOT DETECTED Final   Streptococcus pneumoniae DETECTED (A) NOT DETECTED Final    Comment: CRITICAL RESULT CALLED TO, READ BACK BY AND VERIFIED WITH: C. BALL, PHARM, 09/13/2016 AT 2120 BY J FUDESCO    Streptococcus pyogenes NOT DETECTED NOT DETECTED Final   Acinetobacter baumannii NOT DETECTED NOT DETECTED Final   Enterobacteriaceae species NOT DETECTED NOT DETECTED Final   Enterobacter cloacae complex NOT DETECTED NOT DETECTED Final   Escherichia coli NOT DETECTED NOT DETECTED Final   Klebsiella oxytoca NOT DETECTED NOT  DETECTED Final   Klebsiella pneumoniae NOT DETECTED NOT DETECTED Final   Proteus species NOT DETECTED NOT DETECTED Final   Serratia marcescens NOT DETECTED NOT DETECTED Final   Haemophilus influenzae NOT DETECTED NOT DETECTED Final   Neisseria meningitidis NOT DETECTED NOT DETECTED Final   Pseudomonas aeruginosa NOT DETECTED NOT DETECTED Final    Comment: CRITICAL RESULT CALLED TO, READ BACK BY AND VERIFIED WITH: C. BAL, PHARM, 09/20/2016 AT 2120 BY J FUDESCO    Candida albicans NOT DETECTED NOT DETECTED Final   Candida glabrata NOT DETECTED NOT DETECTED Final   Candida krusei NOT DETECTED NOT DETECTED Final   Candida parapsilosis NOT DETECTED NOT DETECTED Final   Candida tropicalis NOT DETECTED NOT DETECTED Final    Comment: Performed at [facility], [address] [place], [place] [address]  CSF culture     Status: None   Collection Time: 09/03/2016  4:35 AM  Result Value Ref Range Status   Specimen Description CSF  Final   Special Requests NONE  Final   Gram Stain   Final    WBC PRESENT, PREDOMINANTLY PMN GRAM POSITIVE COCCI IN PAIRS CYTOSPIN SMEAR Gram Stain Report Called to,Read Back By and Verified With: COGGINS,IRENE. RN _0  ON 2.5.18 BY NMCCOY    Culture MODERATE STREPTOCOCCUS PNEUMONIAE  Final   Report Status 08/31/2016 FINAL  Final   Organism ID, Bacteria STREPTOCOCCUS PNEUMONIAE  Final      Susceptibility   Streptococcus pneumoniae - MIC*    ERYTHROMYCIN <=0.12 SENSITIVE Sensitive     LEVOFLOXACIN 1 SENSITIVE Sensitive     PENICILLIN <=0.06 SENSITIVE Sensitive     CEFTRIAXONE <=0.12 SENSITIVE Sensitive     * MODERATE STREPTOCOCCUS PNEUMONIAE  MRSA PCR Screening     Status: None   Collection Time: 09/16/2016  9:39 AM  Result Value  Ref Range Status   MRSA by PCR NEGATIVE NEGATIVE Final    Comment:        The GeneXpert MRSA Assay (FDA approved for NASAL specimens only), is one component of a comprehensive MRSA colonization surveillance program. It is  not intended to diagnose MRSA infection nor to guide or monitor treatment for MRSA infections.     Coagulation Studies: No results for input(s): LABPROT, INR in the last 72 hours.  Imaging: Dg Abd 1 View  Result Date: 08/31/2016 CLINICAL DATA:  NG tube placement EXAM: ABDOMEN - 1 VIEW COMPARISON:  04/23/2014. FINDINGS: 1904 hours. NG tube tip is in the distal stomach. Bowel gas pattern is nonspecific. IMPRESSION: NG tube tip is positioned in the distal stomach. Electronically Signed   By: Misty Stanley M.D.   On: 08/31/2016 19:27   Ct Head Wo Contrast  Result Date: 08/31/2016 CLINICAL DATA:  Code stroke EXAM: CT HEAD WITHOUT CONTRAST TECHNIQUE: Contiguous axial images were obtained from the base of the skull through the vertex without intravenous contrast. COMPARISON:  None. FINDINGS: Brain: High-density acute intracranial hemorrhage centered within the RIGHT basal ganglia measuring 2.9 x 3.8 x 2.4 cm (volume = 14 cm^3). Hemorrhage extends into the adjacent lateral ventricle. There is mass effect from the acute intracranial hemorrhage with 5 mm of leftward midline shift. The ventricular hemorrhage extends into the third ventricle and fourth ventricle. Mild subarachnoid hemorrhage in the RIGHT middle cranial fossa No acute infarction identified.  Findings new from CT 08/27/2016 Vascular: No hyperdense vessel or unexpected calcification. Skull: Normal. Negative for fracture or focal lesion. Sinuses/Orbits: Paranasal sinuses and mastoid air cells are clear. Orbits are clear. Other: None. IMPRESSION: 1. Acute intracranial hemorrhage centered within the RIGHT basal ganglia with approximate volume of 14 cubic cm. 2. Mild subarachnoid hemorrhage in the RIGHT middle cranial fossa. 3. Mass effect with 5 mm of leftward midline shift. 4. Acute hemorrhage extends into the ventricular system involving the lateral ventricles, third ventricle and filling the fourth ventricle. Critical Value/emergent results were  called by telephone at the time of interpretation on 08/31/2016 at 6:30 pm to Dr. Vance Gather , who verbally acknowledged these results. Electronically Signed   By: Suzy Bouchard M.D.   On: 08/31/2016 18:33   Dg Chest Port 1 View  Result Date: 08/31/2016 CLINICAL DATA:  Endotracheal tube placement EXAM: PORTABLE CHEST 1 VIEW COMPARISON:  08/29/2016 FINDINGS: 1904 hours. Endotracheal tube tip is just into the right mainstem bronchus and could be pulled back 2-3 cm for more appropriate positioning. Lungs are clear. The NG tube passes into the stomach although the distal tip position is not included on the film. The visualized bony structures of the thorax are intact. Telemetry leads overlie the chest. IMPRESSION: Endotracheal tube tip is directed just barely into the right mainstem bronchus. Tube could be pulled back 2-3 cm for more appropriate positioning. I personally discussed the results of this study with the respiratory therapist, Amy , at 1932 hours on 08/31/2016. Electronically Signed   By: Misty Stanley M.D.   On: 08/31/2016 19:32   Ct Head Code Stroke Wo Contrast  Result Date: 08/31/2016 CLINICAL DATA:  Code stroke.  Hemorrhagic stroke. EXAM: CT HEAD WITHOUT CONTRAST TECHNIQUE: Contiguous axial images were obtained from the base of the skull through the vertex without intravenous contrast. COMPARISON:  CT HEAD August 31, 2016 at 1801 hours FINDINGS: BRAIN: Evolving 3.9 x 2.8 cm RIGHT basal ganglia hematoma, relatively unchanged in size. Intraventricular extension with blood products  throughout all ventricles. 7 mm RIGHT to LEFT midline shift and mild hydrocephalus new from prior CT. No acute large vascular territory infarcts. Similar linear density RIGHT sylvian fissure. Effaced basal cisterns. VASCULAR: Unremarkable. SKULL/SOFT TISSUES: No skull fracture. No significant soft tissue swelling. ORBITS/SINUSES: The included ocular globes and orbital contents are normal.RIGHT mastoid effusion. Paranasal  sinuses are well-aerated. OTHER: Tubing in the oral cavity partially imaged. IMPRESSION: Evolving 3.9 x 2.8 cm RIGHT basal ganglia hematoma with intraventricular extension 7 mm RIGHT to LEFT midline shift and new mild hydrocephalus. Dense RIGHT sylvian fissure, which can be seen with subarachnoid hemorrhage/redistributed blood products, thromboembolism or vascular congestion. No acute large vascular territory infarcts. Acute findings discussed with and reconfirmed by Dr.Joanne Brander, Neurology on 08/31/2016 at 8:50 pm. Electronically Signed   By: Elon Alas M.D.   On: 08/31/2016 20:52    Assessment: 1. Large acute right basal ganglia hemorrhage with mass effect. The patient's condition rapidly deteriorated after symptom onset, consistent with enlarging hematoma and rapidly evolving mass effect.  2. The patient's examination currently shows no evidence of cerebral function. She is not on sedation. Bilateral dilated an unreactive pupils secondary to mass effect upon the midbrain is consistent with an extremely poor prognosis.  3. Pneumococcal meningitis.   Recommendations: Together with Dr. Cyndy Freeze of Neurosurgery, I spoke with the family regarding prognosis. Dr. Hewitt Shorts expressed his medical opinion that the patient's rapid and dramatic decline secondary to large right basal ganglia hemorrhage does not warrant Neurosurgical treatment, which is essentially futile given exam findings most consistent with irreversible brainstem injury that would preclude a meaningful recovery over the long term. I agree with Dr. Hewitt Shorts assessment. I was present with Dr. Cyndy Freeze when he explained his rationale to the family. The patient's spouse expressed understanding.   Electronically signed: Dr. Kerney Elbe 08/31/2016, 9:44 PM

## 2016-08-31 NOTE — Progress Notes (Signed)
[facility]  Date of Admission:  09/16/2016           Day 4 ceftriaxone and dexamethasone  Principal Problem:   Pneumococcal meningitis Active Problems:   Pneumococcal bacteremia   Asthma   Sinusitis   Mastoiditis   . azelastine  2 spray Each Nare BID  . cefTRIAXone (ROCEPHIN)  IV  2 g Intravenous Q12H  . dexamethasone  10 mg Intravenous Q6H  . heparin  5,000 Units Subcutaneous Q8H  . insulin aspart  0-9 Units Subcutaneous TID WC    SUBJECTIVE: She's not having any headache. She had been bothered by nausea, vomiting and dizziness today. She did not get any relief from ondansetron and vomited up her oral promethazine.  Review of Systems: Review of Systems  Constitutional: Positive for malaise/fatigue and weight loss. Negative for chills, diaphoresis and fever.  HENT: Negative for hearing loss and sore throat.   Eyes: Negative for blurred vision.  Respiratory: Negative for cough, sputum production and shortness of breath.   Cardiovascular: Negative for chest pain.  Gastrointestinal: Positive for nausea and vomiting. Negative for abdominal pain, diarrhea and heartburn.  Musculoskeletal: Negative for joint pain, myalgias and neck pain.  Skin: Negative for rash.  Neurological: Positive for dizziness. Negative for speech change, focal weakness and headaches.    Past Medical History:  Diagnosis Date  . Asthma   . Migraines     Social History  Substance Use Topics  . Smoking status: Never Smoker  . Smokeless tobacco: Never Used  . Alcohol use Yes    Family History  Problem Relation Age of Onset  . Cancer Other    No Known Allergies  OBJECTIVE: Vitals:   08/31/16 0620 08/31/16 0849 08/31/16 1235 08/31/16 1242  BP: (!) 152/86 (!) 167/87  (!) 178/97  Pulse:  (!) 55  61  Resp: 12 16    Temp:  98.4 F (36.9 C) 97.8 F (36.6 C)   TempSrc:  Oral Oral   SpO2: 100% 100%  98%  Weight:      Height:       Body mass index is  24.84 kg/m.  Physical Exam  Constitutional: She is oriented to person, place, and time.  She is somewhat sleepy and appears uncomfortable due to nausea and dizziness. She is resting quietly in bed with her eyes closed.  Cardiovascular: Normal rate and regular rhythm.   No murmur heard. Pulmonary/Chest: Effort normal and breath sounds normal. She has no wheezes. She has no rales.  Abdominal: Soft. There is no tenderness.  Neurological: She is alert and oriented to person, place, and time.  No nystagmus.  Skin: No rash noted.  Psychiatric: Mood and affect normal.    Lab Results Lab Results  Component Value Date   WBC 16.9 (H) 08/29/2016   HGB 11.3 (L) 08/29/2016   HCT 33.1 (L) 08/29/2016   MCV 78.3 08/29/2016   PLT 227 08/29/2016    Lab Results  Component Value Date   CREATININE 0.54 08/31/2016   BUN 13 08/30/2016   NA 139 08/30/2016   K 3.5 08/30/2016   CL 106 08/30/2016   CO2 24 08/30/2016    Lab Results  Component Value Date   ALT 16 08/30/2016   AST 20 09/20/2016   ALKPHOS 41 09/16/2016   BILITOT 0.6 09/12/2016     Microbiology: Recent Results (from the past 240 hour(s))  Blood culture (routine x 2)  Status: Abnormal   Collection Time: 09/07/2016  3:53 AM  Result Value Ref Range Status   Specimen Description BLOOD LEFT FOREARM  Final   Special Requests BOTTLES DRAWN AEROBIC AND ANAEROBIC 5ML  Final   Culture  Setup Time   Final    GRAM POSITIVE COCCI IN PAIRS IN BOTH AEROBIC AND ANAEROBIC BOTTLES CRITICAL VALUE NOTED.  VALUE IS CONSISTENT WITH PREVIOUSLY REPORTED AND CALLED VALUE.    Culture (A)  Final    STREPTOCOCCUS PNEUMONIAE SUSCEPTIBILITIES PERFORMED ON PREVIOUS CULTURE WITHIN THE LAST 5 DAYS. Performed at [facility], [address] [place], [place] [address]    Report Status 08/30/2016 FINAL  Final  Blood culture (routine x 2)     Status: Abnormal   Collection Time: 09/08/2016  4:03 AM  Result Value Ref Range Status   Specimen  Description BLOOD RIGHT FOREARM  Final   Special Requests BOTTLES DRAWN AEROBIC AND ANAEROBIC 5ML  Final   Culture  Setup Time   Final    GRAM POSITIVE COCCI IN PAIRS IN BOTH AEROBIC AND ANAEROBIC BOTTLES CRITICAL RESULT CALLED TO, READ BACK BY AND VERIFIED WITH: L. Otho DarnerOINDEXTER, PHARM, 09/05/2016 AT 1934 BY J FUDESCO Performed at [facility], [address] [place], [place] [address]    Culture STREPTOCOCCUS PNEUMONIAE (A)  Final   Report Status 08/30/2016 FINAL  Final   Organism ID, Bacteria STREPTOCOCCUS PNEUMONIAE  Final      Susceptibility   Streptococcus pneumoniae - MIC*    ERYTHROMYCIN <=0.12 SENSITIVE Sensitive     LEVOFLOXACIN 0.5 SENSITIVE Sensitive     PENICILLIN <=0.06 SENSITIVE Sensitive     CEFTRIAXONE <=0.12 SENSITIVE Sensitive     * STREPTOCOCCUS PNEUMONIAE  Blood Culture ID Panel (Reflexed)     Status: Abnormal   Collection Time: 08/26/2016  4:03 AM  Result Value Ref Range Status   Enterococcus species NOT DETECTED NOT DETECTED Final   Listeria monocytogenes NOT DETECTED NOT DETECTED Final   Staphylococcus species NOT DETECTED NOT DETECTED Final   Staphylococcus aureus NOT DETECTED NOT DETECTED Final   Streptococcus species DETECTED (A) NOT DETECTED Final   Streptococcus agalactiae NOT DETECTED NOT DETECTED Final   Streptococcus pneumoniae DETECTED (A) NOT DETECTED Final    Comment: CRITICAL RESULT CALLED TO, READ BACK BY AND VERIFIED WITH: C. BALL, PHARM, 08/30/2016 AT 2120 BY J FUDESCO    Streptococcus pyogenes NOT DETECTED NOT DETECTED Final   Acinetobacter baumannii NOT DETECTED NOT DETECTED Final   Enterobacteriaceae species NOT DETECTED NOT DETECTED Final   Enterobacter cloacae complex NOT DETECTED NOT DETECTED Final   Escherichia coli NOT DETECTED NOT DETECTED Final   Klebsiella oxytoca NOT DETECTED NOT DETECTED Final   Klebsiella pneumoniae NOT DETECTED NOT DETECTED Final   Proteus species NOT DETECTED NOT DETECTED Final   Serratia marcescens NOT DETECTED  NOT DETECTED Final   Haemophilus influenzae NOT DETECTED NOT DETECTED Final   Neisseria meningitidis NOT DETECTED NOT DETECTED Final   Pseudomonas aeruginosa NOT DETECTED NOT DETECTED Final    Comment: CRITICAL RESULT CALLED TO, READ BACK BY AND VERIFIED WITH: C. BAL, PHARM, 08/31/2016 AT 2120 BY J FUDESCO    Candida albicans NOT DETECTED NOT DETECTED Final   Candida glabrata NOT DETECTED NOT DETECTED Final   Candida krusei NOT DETECTED NOT DETECTED Final   Candida parapsilosis NOT DETECTED NOT DETECTED Final   Candida tropicalis NOT DETECTED NOT DETECTED Final    Comment: Performed at [facility], [address] [place], [place] [address]  CSF culture     Status: None   Collection Time: September 06, 2016  4:35 AM  Result Value Ref Range Status   Specimen Description CSF  Final   Special Requests NONE  Final   Gram Stain   Final    WBC PRESENT, PREDOMINANTLY PMN GRAM POSITIVE COCCI IN PAIRS CYTOSPIN SMEAR Gram Stain Report Called to,Read Back By and Verified With: COGGINS,IRENE. RN @0545  ON 2016/09/06 BY [email or web address]    Culture MODERATE STREPTOCOCCUS PNEUMONIAE  Final   Report Status 08/31/2016 FINAL  Final   Organism ID, Bacteria STREPTOCOCCUS PNEUMONIAE  Final      Susceptibility   Streptococcus pneumoniae - MIC*    ERYTHROMYCIN <=0.12 SENSITIVE Sensitive     LEVOFLOXACIN 1 SENSITIVE Sensitive     PENICILLIN <=0.06 SENSITIVE Sensitive     CEFTRIAXONE <=0.12 SENSITIVE Sensitive     * MODERATE STREPTOCOCCUS PNEUMONIAE  MRSA PCR Screening     Status: None   Collection Time: 2016/09/06  9:39 AM  Result Value Ref Range Status   MRSA by PCR NEGATIVE NEGATIVE Final    Comment:        The GeneXpert MRSA Assay (FDA approved for NASAL specimens only), is one component of a comprehensive MRSA colonization surveillance program. It is not intended to diagnose MRSA infection nor to guide or monitor treatment for MRSA infections.      ASSESSMENT: Her pneumococcal bacteremia and meningitis  are improving and she is no longer having any headache. It is unclear what is causing the nausea, vomiting and dizziness. I doubt that it is due to ongoing CNS dysfunction from her meningitis given her overall improvement. He may be related to her dexamethasone. She only has 1 more dose remaining and I will go ahead and stop it now. Her pneumococcal isolate is sensitive to ceftriaxone so we have stopped the vancomycin. I will plan on 10 days total therapy with IV ceftriaxone. She can have a midline or PICC placed to complete therapy. If her nausea and vomiting resolve she could go home to complete therapy. Following completion of 10 days of ceftriaxone plan on giving her 2 more weeks of oral amoxicillin to complete therapy for her ethmoid sinusitis and right mastoiditis.  PLAN: 1. Continue ceftriaxone for 6 more days 2. Discontinue dexamethasone 3. I will follow up in the morning  Cliffton Asters, MD [facility] [phone] pager   [phone] cell 08/31/2016, 3:48 PM

## 2016-09-01 ENCOUNTER — Inpatient Hospital Stay (HOSPITAL_COMMUNITY): Payer: 59

## 2016-09-01 DIAGNOSIS — I629 Nontraumatic intracranial hemorrhage, unspecified: Secondary | ICD-10-CM

## 2016-09-01 DIAGNOSIS — G001 Pneumococcal meningitis: Secondary | ICD-10-CM

## 2016-09-01 LAB — CBC
HCT: 36.6 % (ref 36.0–46.0)
HEMOGLOBIN: 11.8 g/dL — AB (ref 12.0–15.0)
MCH: 25.7 pg — AB (ref 26.0–34.0)
MCHC: 32.2 g/dL (ref 30.0–36.0)
MCV: 79.7 fL (ref 78.0–100.0)
Platelets: 313 10*3/uL (ref 150–400)
RBC: 4.59 MIL/uL (ref 3.87–5.11)
RDW: 15.4 % (ref 11.5–15.5)
WBC: 6.5 10*3/uL (ref 4.0–10.5)

## 2016-09-01 LAB — BASIC METABOLIC PANEL
Anion gap: 10 (ref 5–15)
BUN: 9 mg/dL (ref 6–20)
CHLORIDE: 115 mmol/L — AB (ref 101–111)
CO2: 28 mmol/L (ref 22–32)
CREATININE: 0.58 mg/dL (ref 0.44–1.00)
Calcium: 8.1 mg/dL — ABNORMAL LOW (ref 8.9–10.3)
GFR calc Af Amer: >60 mL/min (ref >60)
GFR calc non Af Amer: >60 mL/min (ref >60)
Glucose, Bld: 118 mg/dL — ABNORMAL HIGH (ref 65–99)
Potassium: 3.3 mmol/L — ABNORMAL LOW (ref 3.5–5.1)
Sodium: 153 mmol/L — ABNORMAL HIGH (ref 135–145)

## 2016-09-01 LAB — GLUCOSE, CAPILLARY
Glucose-Capillary: 123 mg/dL — ABNORMAL HIGH (ref 65–99)
Glucose-Capillary: 176 mg/dL — ABNORMAL HIGH (ref 65–99)
Glucose-Capillary: 207 mg/dL — ABNORMAL HIGH (ref 65–99)
Glucose-Capillary: 82 mg/dL (ref 65–99)

## 2016-09-01 LAB — POCT I-STAT 3, ART BLOOD GAS (G3+)
Acid-Base Excess: 5 mmol/L — ABNORMAL HIGH (ref 0.0–2.0)
BICARBONATE: 29.5 mmol/L — AB (ref 20.0–28.0)
O2 Saturation: 100 %
Patient temperature: 97
TCO2: 31 mmol/L (ref 0–100)
pCO2 arterial: 39.9 mmHg (ref 32.0–48.0)
pH, Arterial: 7.473 — ABNORMAL HIGH (ref 7.350–7.450)
pO2, Arterial: 201 mmHg — ABNORMAL HIGH (ref 83.0–108.0)

## 2016-09-01 LAB — PHOSPHORUS: Phosphorus: 2.6 mg/dL (ref 2.5–4.6)

## 2016-09-01 LAB — MAGNESIUM: Magnesium: 2.2 mg/dL (ref 1.7–2.4)

## 2016-09-01 MED ORDER — SODIUM CHLORIDE 0.9 % IV BOLUS (SEPSIS)
500.0000 mL | Freq: Once | INTRAVENOUS | Status: AC
  Administered 2016-09-01: 500 mL via INTRAVENOUS

## 2016-09-01 MED ORDER — MORPHINE SULFATE (PF) 2 MG/ML IV SOLN: 2.0000 mg | INTRAVENOUS | Status: DC | PRN

## 2016-09-06 ENCOUNTER — Telehealth: Payer: Self-pay

## 2016-09-06 NOTE — Telephone Encounter (Signed)
On 09/06/16 I received a death certificate from [facility] [employer] (orginal). The death certificate is for burial. The patient is a patient of Doctor Byrum. The death certificate will be taken to Pulmonary Unit @ [facility] this am for signature.  On 09/07/16 I received the death certificate back from Doctor Byrum. I got the death certificate ready and called the funeral home to let them know that the death certificate is ready for pickup.

## 2016-09-21 NOTE — Procedures (Signed)
PCCM Procedure Note   I performed apnea testing on the patient with O2. There was no breathing noted, but the test only went for 4 minutes 40 seconds because she dropped her BP to 40's systolic. I placed her back on MV and her BP improved back to its prior baseline of 90's systolic. Based on this evaluation, the apnea test was too short, will be reported as indeterminate. I will not check an follow up ABG. Initial ABG from 9:50am is below.    Recent Labs Lab 01/19/2017 0745 08/29/16 0445 08/31/16 2000 08/31/16 2126 08/29/2016 0950  PHART 7.413 7.367 7.609* 7.429 7.473*  PCO2ART 32.4 43.6 23.7* 38.7 39.9  PO2ART 548* 149* 495* 206* 201.0*  HCO3 20.3 24.4 24.1 25.1 29.5*  TCO2  --   --   --   --  31  O2SAT 99.6 98.7 100.0 99.1 100.0   Levy Pupaobert Byrum, MD, PhD 08/24/2016, 12:35 PM [facility] [phone] or if no answer [phone]

## 2016-09-21 NOTE — Progress Notes (Signed)
Vascular Ultrasound Transcranial Doppler has been completed.   09/10/2016 2:18 PM Gertie FeyMichelle Jabreel Chimento, BS, RVT, RDCS, RDMS

## 2016-09-21 NOTE — Progress Notes (Signed)
PULMONARY / CRITICAL CARE MEDICINE   Name: Madeline Kelly MRN: [id number] DOB: 05-31-1981    ADMISSION DATE:  09/16/2016 CONSULTATION DATE:  08/31/2016  REFERRING MD:  Dr. Jarvis Newcomer  CHIEF COMPLAINT: ICH  HISTORY OF PRESENT ILLNESS:   36 year old female with PMH of Asthma and Migraines presents to ED on 2/5 found to have pneumococcal meningitis and bacteremia. Was extubated on 2/6 and transferred out of ICU on 2/7. On 2/8 patient had acute neuro changes with left sided weakness, garbled speech, and flushed face. Taken for STAT head CT with acute intracranial hemorrhage within the right basal ganglia with approximately 14cc of volume. PCCM called to consult.   SUBJECTIVE:  Note rapid decline in neuro exam yesterday pm, NSGY notes and extremely poor prognosis  VITAL SIGNS: BP 97/72   Pulse 77   Temp (!) 96.1 F (35.6 C) (Axillary)   Resp 10   Ht 5\' 6"  (1.676 m)   Wt 67.9 kg (149 lb 11.1 oz)   SpO2 100%   BMI 24.16 kg/m   HEMODYNAMICS:    VENTILATOR SETTINGS: Vent Mode: PRVC FiO2 (%):  [40 %-100 %] 40 % Set Rate:  [10 bmp-20 bmp] 10 bmp Vt Set:  [510 mL] 510 mL PEEP:  [5 cmH20] 5 cmH20 Plateau Pressure:  [14 cmH20-16 cmH20] 14 cmH20  INTAKE / OUTPUT: I/O last 3 completed shifts: In: 4200 [I.V.:1300; Other:1250; IV Piggyback:1650] Out: 2300 [Urine:2300]  PHYSICAL EXAMINATION: General: Adult female, flushed Neuro:  Lethargic, follows commands at times, moves right upper arm, left arm flaccid HEENT:  Normocephalic  Cardiovascular:  Tachy, no MRG, NI S1/S2 Lungs:  Clear, diminished, unlabored  Abdomen:  Non-tender, non-distended, active bowel sounds  Musculoskeletal:  No edema  Skin:  Warm, dry, intact   LABS:  BMET  Recent Labs Lab 08/30/16 0510 08/31/16 0331 08/31/16 1923 09-18-16 0931  NA 139  --  134* 153*  K 3.5  --  2.8* 3.3*  CL 106  --  97* 115*  CO2 24  --  23 28  BUN 13  --  10 9  CREATININE 0.54 0.54 0.52 0.58  GLUCOSE 202*  --  200* 118*     Electrolytes  Recent Labs Lab 08/29/16 0339 08/30/16 0510 08/31/16 1923 2016/09/18 0931  CALCIUM 7.3* 8.0* 8.1* 8.1*  MG 1.8 2.3  --  2.2  PHOS 2.4*  --   --  2.6    CBC  Recent Labs Lab 08/29/16 0339 08/31/16 1923 2016-09-18 0931  WBC 16.9* 9.6 6.5  HGB 11.3* 14.8 11.8*  HCT 33.1* 41.5 36.6  PLT 227 396 313    Coag's  Recent Labs Lab 08/29/2016 0954  APTT 32  INR 1.27    Sepsis Markers  Recent Labs Lab 09/15/2016 0305 09/12/2016 0419 08/31/2016 0954 09/11/2016 1338 08/29/16 0339  LATICACIDVEN  --  2.36* 3.5* 3.1*  --   PROCALCITON 0.21  --   --   --  3.96    ABG  Recent Labs Lab 08/31/16 2000 08/31/16 2126 18-Sep-2016 0950  PHART 7.609* 7.429 7.473*  PCO2ART 23.7* 38.7 39.9  PO2ART 495* 206* 201.0*    Liver Enzymes  Recent Labs Lab 09/10/2016 0306  AST 20  ALT 16  ALKPHOS 41  BILITOT 0.6  ALBUMIN 3.4*    Cardiac Enzymes No results for input(s): TROPONINI, PROBNP in the last 168 hours.  Glucose  Recent Labs Lab 08/31/16 1139 08/31/16 1748 2016-09-18 0004 2016-09-18 0324 2016-09-18 0744 09/18/16 1121  GLUCAP 158* 153* 207*  176* 82 123*    Imaging Dg Abd 1 View  Result Date: 08/31/2016 CLINICAL DATA:  NG tube placement EXAM: ABDOMEN - 1 VIEW COMPARISON:  04/23/2014. FINDINGS: 1904 hours. NG tube tip is in the distal stomach. Bowel gas pattern is nonspecific. IMPRESSION: NG tube tip is positioned in the distal stomach. Electronically Signed   By: Kennith CenterEric  Mansell M.D.   On: 08/31/2016 19:27   Ct Head Wo Contrast  Result Date: 08/31/2016 CLINICAL DATA:  Code stroke EXAM: CT HEAD WITHOUT CONTRAST TECHNIQUE: Contiguous axial images were obtained from the base of the skull through the vertex without intravenous contrast. COMPARISON:  None. FINDINGS: Brain: High-density acute intracranial hemorrhage centered within the RIGHT basal ganglia measuring 2.9 x 3.8 x 2.4 cm (volume = 14 cm^3). Hemorrhage extends into the adjacent lateral ventricle. There is  mass effect from the acute intracranial hemorrhage with 5 mm of leftward midline shift. The ventricular hemorrhage extends into the third ventricle and fourth ventricle. Mild subarachnoid hemorrhage in the RIGHT middle cranial fossa No acute infarction identified.  Findings new from CT Jul 05, 2017 Vascular: No hyperdense vessel or unexpected calcification. Skull: Normal. Negative for fracture or focal lesion. Sinuses/Orbits: Paranasal sinuses and mastoid air cells are clear. Orbits are clear. Other: None. IMPRESSION: 1. Acute intracranial hemorrhage centered within the RIGHT basal ganglia with approximate volume of 14 cubic cm. 2. Mild subarachnoid hemorrhage in the RIGHT middle cranial fossa. 3. Mass effect with 5 mm of leftward midline shift. 4. Acute hemorrhage extends into the ventricular system involving the lateral ventricles, third ventricle and filling the fourth ventricle. Critical Value/emergent results were called by telephone at the time of interpretation on 08/31/2016 at 6:30 pm to Dr. Hazeline JunkerYAN GRUNZ , who verbally acknowledged these results. Electronically Signed   By: Genevive BiStewart  Edmunds M.D.   On: 08/31/2016 18:33   Portable Chest Xray  Result Date: 08/24/2016 CLINICAL DATA:  Ventilator, pneumococcal bacteremia and meningitis, history asthma, acute on chronic respiratory failure with hypoxemia EXAM: PORTABLE CHEST 1 VIEW COMPARISON:  Portable exam 0549 hours compared to 08/31/2016 FINDINGS: Tip of endotracheal tube projects 2.4 cm above carina. Nasogastric tube extends into stomach. Normal heart size, mediastinal contours, and pulmonary vascularity. No gross infiltrate, pleural effusion or pneumothorax. Bones unremarkable. IMPRESSION: No acute abnormalities. Electronically Signed   By: Ulyses SouthwardMark  Boles M.D.   On: 09/04/2016 08:41   Dg Chest Port 1 View  Result Date: 08/31/2016 CLINICAL DATA:  Endotracheal tube placement EXAM: PORTABLE CHEST 1 VIEW COMPARISON:  08/29/2016 FINDINGS: 1904 hours. Endotracheal  tube tip is just into the right mainstem bronchus and could be pulled back 2-3 cm for more appropriate positioning. Lungs are clear. The NG tube passes into the stomach although the distal tip position is not included on the film. The visualized bony structures of the thorax are intact. Telemetry leads overlie the chest. IMPRESSION: Endotracheal tube tip is directed just barely into the right mainstem bronchus. Tube could be pulled back 2-3 cm for more appropriate positioning. I personally discussed the results of this study with the respiratory therapist, Amy , at 1932 hours on 08/31/2016. Electronically Signed   By: Kennith CenterEric  Mansell M.D.   On: 08/31/2016 19:32   Ct Head Code Stroke Wo Contrast  Result Date: 08/31/2016 CLINICAL DATA:  Code stroke.  Hemorrhagic stroke. EXAM: CT HEAD WITHOUT CONTRAST TECHNIQUE: Contiguous axial images were obtained from the base of the skull through the vertex without intravenous contrast. COMPARISON:  CT HEAD August 31, 2016 at 1801 hours FINDINGS:  BRAIN: Evolving 3.9 x 2.8 cm RIGHT basal ganglia hematoma, relatively unchanged in size. Intraventricular extension with blood products throughout all ventricles. 7 mm RIGHT to LEFT midline shift and mild hydrocephalus new from prior CT. No acute large vascular territory infarcts. Similar linear density RIGHT sylvian fissure. Effaced basal cisterns. VASCULAR: Unremarkable. SKULL/SOFT TISSUES: No skull fracture. No significant soft tissue swelling. ORBITS/SINUSES: The included ocular globes and orbital contents are normal.RIGHT mastoid effusion. Paranasal sinuses are well-aerated. OTHER: Tubing in the oral cavity partially imaged. IMPRESSION: Evolving 3.9 x 2.8 cm RIGHT basal ganglia hematoma with intraventricular extension 7 mm RIGHT to LEFT midline shift and new mild hydrocephalus. Dense RIGHT sylvian fissure, which can be seen with subarachnoid hemorrhage/redistributed blood products, thromboembolism or vascular congestion. No acute  large vascular territory infarcts. Acute findings discussed with and reconfirmed by Dr.Lindzen, Neurology on 08/31/2016 at 8:50 pm. Electronically Signed   By: Awilda Metro M.D.   On: 08/31/2016 20:52     STUDIES:  CT Head 2/8 > acute intracranial hemorrhage within the right basal ganglia with approximately 14cc of volume  CULTURES: BCX2 2/5 > gpc > strep  Csf 2/5 > gpc > step  MRSA by PCR 2/5 >   ANTIBIOTICS: vanc 2/5 > 2/7 Rocephin 2/5 >  SIGNIFICANT EVENTS: 2/5 > Presents to ED > diagnosis with bacterial meningitis  2/8 > found new ICH   LINES/TUBES: oett 2/5 > 2/6 ETT 2/8 >    DISCUSSION: 36 year old female admitted with Bacterial Meningitis on 2/5, intubated for airway protection, extubated 2/6 and sent to floor on 2/7. On 2/8 experienced neuro changes, found new ICH on head CT.   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 protection H/O Asthma   P:   VDRF for AMS in setting devastating head bleed. Continue current MV support for now pending apnea testing and discussion regarding neuro prognosis.   CARDIOVASCULAR A:  HTN P:  cardene prn SBP > 140  RENAL A:   Hypokalemia  P:   Follow BMP  GASTROINTESTINAL A:   Dysphagia  P:   NPO  HEMATOLOGIC A:   ICH P:  Follow CBC  INFECTIOUS A:   Acute Bacterial Meningitis  P:   Being treated with ceftriaxone  ENDOCRINE A:   Hyperglycemia  P:   Scheduled CBG checks SSI   NEUROLOGIC A:   New ICH  Acute Encephalopathy in setting of bacterial meningitis  P:   Review of Neurology and NSGY notes reveals extremely poor prognosis. Based on current exam she may be brain dead. I will arrange for apnea testing today. If unable to tolerate then will arrange for TCD exam.    FAMILY  - Updates:  I spoke with her family at bedside 2/9. They understand that she will not improve, that this is a fatal event. I will try to get them information about whether she is  brain dead. Regardless of results of brain death testing,  I know that they will want to withdraw care. We will assist with this after the testing is done.   Independent CC time 45 minutes  Levy Pupa, MD, PhD 2016-09-28, 12:27 PM [facility] [phone] or if no answer [phone]

## 2016-09-21 NOTE — Progress Notes (Signed)
Apnea test performed on patient. Patient placed on 8l through ETT. Apnea test terminated at 5 minutes due to patient BP below 90. Dr. Delton CoombesByrum, MD and RN at bedside at time of test.  Patient returned to previous full support vent settings. RT will continue to monitor.

## 2016-09-21 NOTE — Progress Notes (Signed)
Pt has died at 4:20PM. No EKG activity, no respirations, no pulse, no heart sounds auscultated to 2 RNs. Family at bedside.

## 2016-09-21 NOTE — Progress Notes (Signed)
PT Cancellation/Discharge Note  Patient Details Name: Madeline Kelly MRN: [id number] DOB: [birth year]   Cancelled Treatment:    Reason Eval/Treat Not Completed: Medical issues which prohibited therapy; noted pt intubated with rapid decline.  PT to sign off.     Elray McgregorCynthia Wynn 09/13/2016, 8:39 AM  Sheran Lawlessyndi Wynn, PT [phone] 09/10/2016

## 2016-09-21 NOTE — Progress Notes (Signed)
Patient terminally extubated to room air per MD order. No complications. Vital signs stable at this time. No complications. Family and RN at bedside. RT will continue to monitor.

## 2016-09-21 NOTE — Progress Notes (Signed)
Transcranial doppler results reviewed - no cerebral flow consistent with clinical brain death.  Results reviewed with husband and sister-in-law.    Plan: Proceed with extubation / comfort measures PRN morphine if needed for SOB/evidence of distress   Canary BrimBrandi Arynn Armand, NP-C [facility] Pgr: [phone] or if no answer [phone] 09/12/2016, 3:46 PM

## 2016-09-21 NOTE — Progress Notes (Signed)
Patient ID: Madeline Kelly, female   DOB: 1981/01/29, 36 y.o.   MRN: [id number]  BP 94/65   Pulse 79   Temp 98.2 F (36.8 C) (Axillary)   Resp 10   Ht 5\' 6"  (1.676 m)   Wt 67.9 kg (149 lb 11.1 oz)   SpO2 100%   BMI 24.16 kg/m  Comatose, Pupils fixed and dilated, no oculocephalics No corneals No cough No gag No response to noxious stimuli Prognosis grim, no change in exam since yesterday evening.

## 2016-09-21 NOTE — Progress Notes (Signed)
Nutrition Brief Note  Chart reviewed. Pt with acute right basal ganglia hemorrhage in the setting of pneumococcal meningitis. Per MD notes prognosis is grim. Pt discussed during ICU rounds and with RN. Apnea testing planned.  No nutrition interventions at this time.  Please consult as needed.   Kendell BaneHeather Tavon Magnussen RD, LDN, CNSC [phone] Pager [phone] After Hours Pager

## 2016-09-21 NOTE — Progress Notes (Signed)
STROKE TEAM PROGRESS NOTE   SUBJECTIVE (INTERVAL HISTORY) No family is at the bedside this am. Her exam is consistent with brain death.    OBJECTIVE Temp:  [92.8 F (33.8 C)-98.8 F (37.1 C)] 97 F (36.1 C) (02/09 0800) Pulse Rate:  [61-153] 79 (02/09 0700) Cardiac Rhythm: Sinus tachycardia (02/08 2100) Resp:  [10-25] 10 (02/09 0700) BP: (82-196)/(52-130) 94/65 (02/09 0750) SpO2:  [98 %-100 %] 100 % (02/09 0700) FiO2 (%):  [40 %-100 %] 40 % (02/09 0751) Weight:  [67.9 kg (149 lb 11.1 oz)] 67.9 kg (149 lb 11.1 oz) (02/09 0300)  Ct Head Code Stroke Wo Contrast 08/31/2016  Evolving 3.9 x 2.8 cm RIGHT basal ganglia hematoma with intraventricular extension 7 mm RIGHT to LEFT midline shift and new mild hydrocephalus. Dense RIGHT sylvian fissure, which can be seen with subarachnoid hemorrhage/redistributed blood products, thromboembolism or vascular congestion. No acute large vascular territory infarcts.   Ct Head Wo Contrast  08/31/2016 1. Acute intracranial hemorrhage centered within the RIGHT basal ganglia with approximate volume of 14 cubic cm. 2. Mild subarachnoid hemorrhage in the RIGHT middle cranial fossa. 3. Mass effect with 5 mm of leftward midline shift. 4. Acute hemorrhage extends into the ventricular system involving the lateral ventricles, third ventricle and filling the fourth ventricle.    PHYSICAL EXAM Young african [place] lady intubated and not on sedation. . Afebrile. Head is nontraumatic. Neck is supple without bruit.    Cardiac exam no murmur or gallop. Lungs are clear to auscultation. Distal pulses are well felt.  Neurological Exam :  Eyes closed. Comatose. Not following any commands Pupils both large 8 mm fixed and nonreactive. Corneal reflexes are absent bilaterally. Fundi were not visualized. Dolls eye movements absent. No cough or gag response to suctioning endotracheal tube. No motor response to sternal rub. Trace left lower extremity withdrawal to noxious stimuli.  Both plantars upgoing. ASSESSMENT/PLAN Ms. Madeline Kelly is a 36 y.o. female with history of Pneumococcal meningitis and bacteremia. He developed acute neurologic changes with left-sided weakness, garbled speech yesterday following nausea and vomiting. CT showed a large right basal ganglia intracerebral hemorrhage with intraventricular extension. She was transferred from [facility] to [facility] for further evaluation and treatment and care by the Stroke Team.  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 5 mm mass effect. IVH.   Repeat CT head with evolving right basal ganglia ICH with IVH and increasing right to left shift 7 mm. R SAH. No ischemia.  2D Echo  No vegetations on the heart valve Unfortunately, not survivable large right brain hemorrhage in setting of pneumococcal meningitis and bacteremia. Nothing further to add neurologic standpoint. Doubt will be able to tolerate apnea testing. TCD testing can be done for brain death if family does not desire withdrawal.   Hospital day # 4  [name] See Amion for Pager information 09/08/2016 1:15 PM  I have personally examined this patient, reviewed notes, independently viewed imaging studies, participated in medical decision making and plan of care.ROS completed by me personally and pertinent positives fully documented  I have made any additions or clarifications directly to the above note. Agree with note above.. The patient has unfortunately suffered a large right  basal ganglia hemorrhage with intraventricular extension, hydrocephalus and brain herniation with neurological exam suggestive of clinical brain death. Patient  has pneumococcal meningitis with now large brain hemorrhage and chances of survival are nonexistent. I had a long discussion with the patient's husband and mother-in-law at the bedside and answered questions. Recommend apnea test and if inconclusive may consider doing bedside transcranial Doppler study to confirm brain death. Family is understanding. Discussed with Dr. Delton CoombesByrum This patient is critically ill and at significant risk of neurological worsening, death and care requires constant monitoring of vital signs, hemodynamics,respiratory and cardiac monitoring, extensive review of multiple databases, frequent neurological assessment, discussion with family, other specialists and medical decision making of high complexity.I have made any additions or clarifications directly to the above note.This critical care time does not reflect procedure time, or teaching time or supervisory time of PA/NP/Med Resident etc but could involve care discussion time.  I spent 50 minutes of neurocritical care time  in the care of  this patient.      Delia HeadyPramod Sethi, MD Medical Director [facility] Pager: [phone] 09/13/2016 1:19 PM  To contact Stroke Continuity provider, please refer to [email or web address]. After hours, contact General Neurology

## 2016-09-21 NOTE — Progress Notes (Signed)
eLink Physician-Brief Progress Note Patient Name: Madeline FusiKathryn Teston DOB: 09/05/1980 MRN: [id number]   Date of Service  08/31/2016  HPI/Events of Note  Hypotension with MAP of 61.  Is net positive over past 24 hour approx 2 liters.  O2 sat of 100%  eICU Interventions  Plan: 500 cc NS fluid bolus for BP support     Intervention Category Intermediate Interventions: Hypotension - evaluation and management  DETERDING,ELIZABETH 09/12/2016, 3:17 AM

## 2016-09-21 DEATH — deceased

## 2016-10-19 ENCOUNTER — Encounter (HOSPITAL_COMMUNITY): Payer: Self-pay | Admitting: Emergency Medicine

## 2016-10-22 NOTE — Discharge Summary (Signed)
PULMONARY / CRITICAL CARE MEDICINE   Name: Madeline Kelly MRN: [id number] DOB: 1981-05-26    ADMISSION DATE:  09/03/2016 DATE OF DEATH: 09/14/2016  Final cause of death Large intracranial hemorrhage  Secondary causes of death Pneumococcal bacterial meningitis Acute respiratory failure Toxic metabolic encephalopathy History hypertension History of asthma Hypokalemia Hyperglycemia History of migraine headaches   HISTORY OF PRESENT ILLNESS / Hospital; course:   36 year old female with PMH of Asthma and Migraines presents to ED on 2/5 found to have pneumococcal meningitis and bacteremia. Treated w antibiotics and improved. Was extubated on 2/6 and transferred out of ICU on 2/7. On 2/8 patient had acute neuro changes with left sided weakness, garbled speech, and flushed face. Taken for STAT head CT with acute intracranial hemorrhage within the right basal ganglia with approximately 14cc of volume. She was certainly intubated for airway protection. Unfortunately given the size of the intercranial hemorrhage neurosurgical intervention was not possible. Her mental status and neurological exam rapidly declined. Apnea testing was attempted but was not completed because it caused hemodynamic instability. Subsequently transcranial Dopplers were done on 09/12/2016 which showed no cerebral blood flow. She was declared brain dead at that time.    STUDIES:  CT Head 2/8 > acute intracranial hemorrhage within the right basal ganglia with approximately 14cc of volume  CULTURES: BCX2 2/5 > gpc > strep  Csf 2/5 > gpc > step  MRSA by PCR 2/5 >   ANTIBIOTICS: vanc 2/5 > 2/7 Rocephin 2/5 >  SIGNIFICANT EVENTS: 2/5 > Presents to ED > diagnosis with bacterial meningitis  2/8 > found new ICH   LINES/TUBES: oett 2/5 > 2/6 ETT 2/8 >   Levy Pupaobert Katrenia Alkins, MD, PhD 10/19/2016, 7:40 AM [facility] [phone] or if no answer [phone]

## 2018-11-29 IMAGING — CT CT HEAD W/O CM
3 of 4 series · 16 of 47 positions shown, 19 images · non-contrast
Comparison: None.

CLINICAL DATA: Altered mental status. Headaches started around 8844
hours on 08/27/2016. Increasing pain. Nausea and vomiting. No
injury.

EXAM:
CT HEAD WITHOUT CONTRAST
TECHNIQUE: Contiguous axial images were obtained from the base of the skull
through the vertex without intravenous contrast.

[Series 2: head w/o · axial · non-contrast · 0.45mm/px · z∈[-149,-4]mm · 10 of 35 slices shown, 13 images]
[im 3/35  brain]
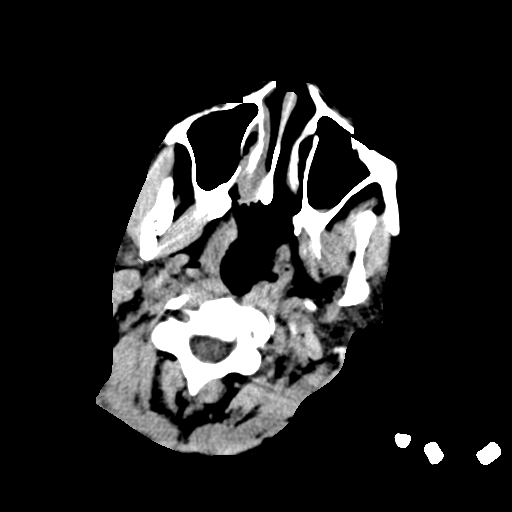
[im 3/35  bone]
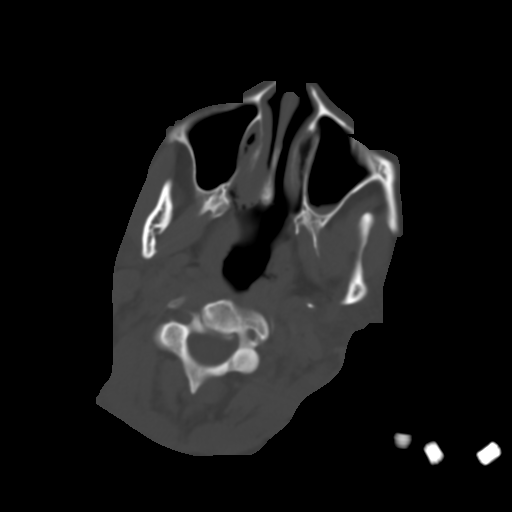
[im 5/35  brain]
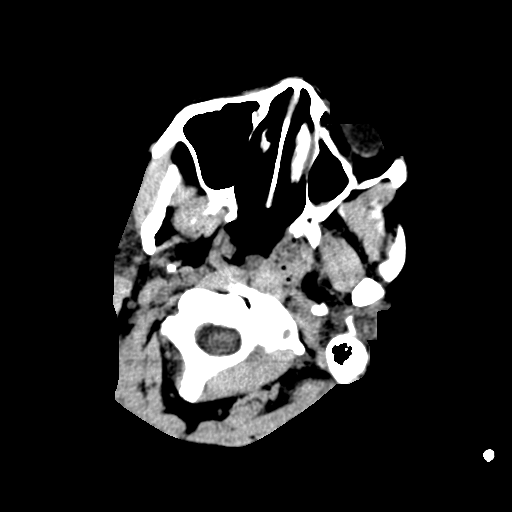
[im 10/35  brain]
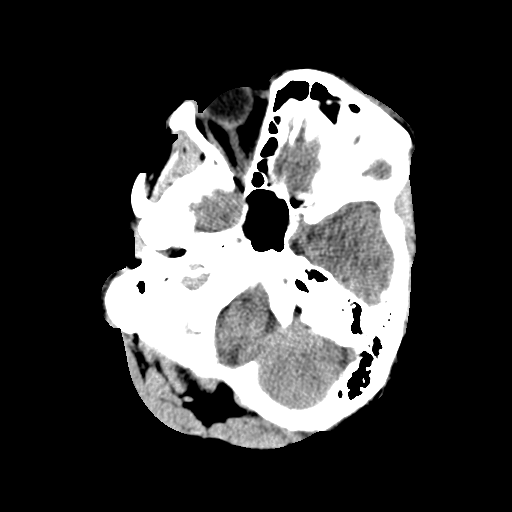
[im 13/35  brain]
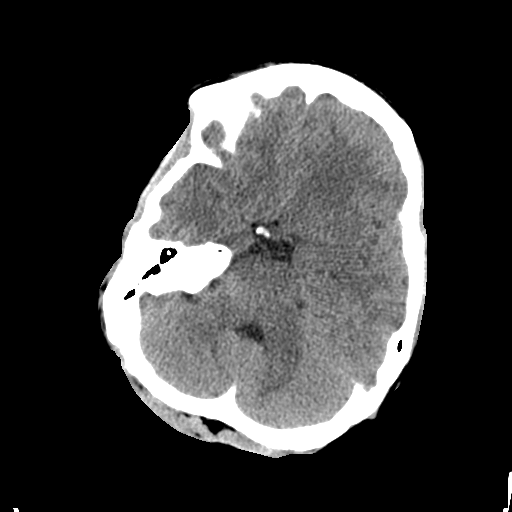
[im 15/35  brain]
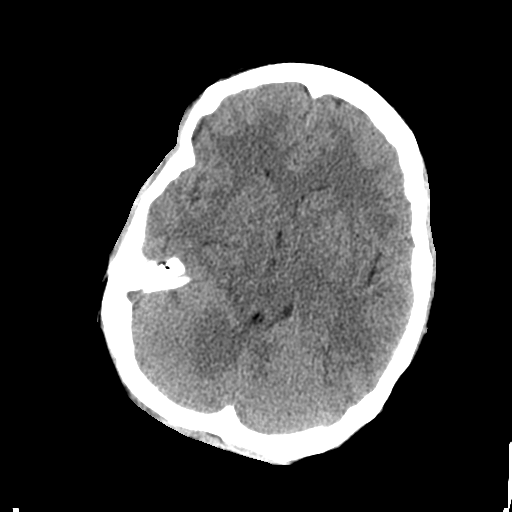
[im 15/35  bone]
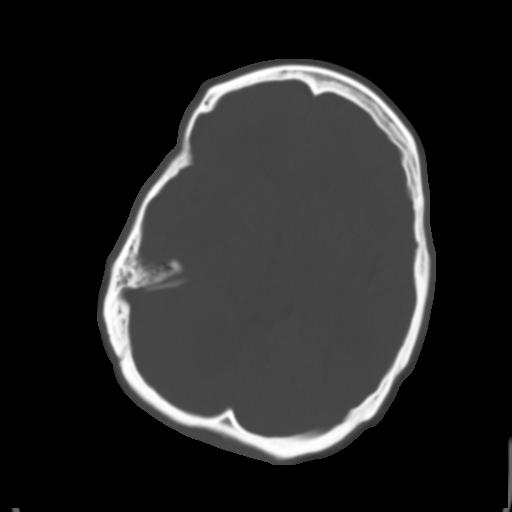
[im 20/35  brain]
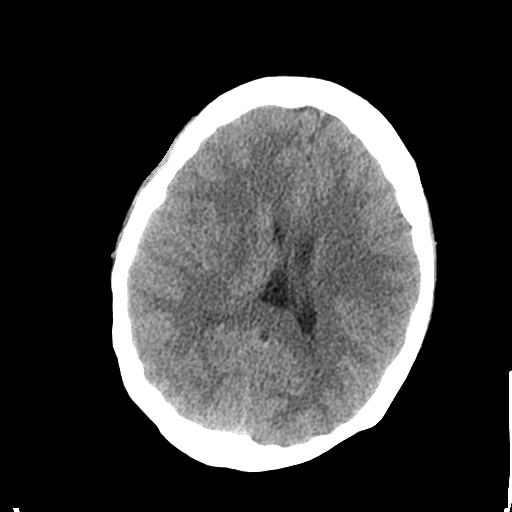
[im 22/35  brain]
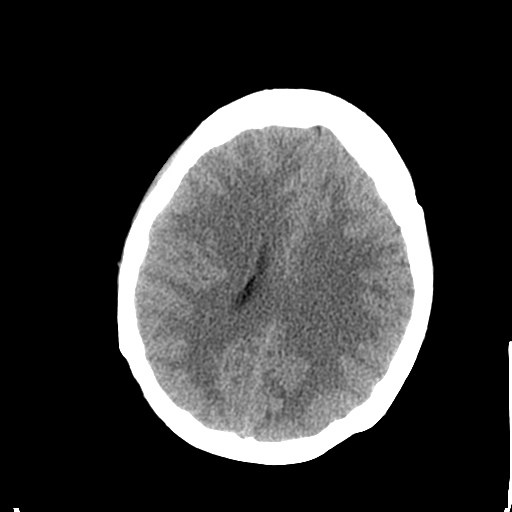
[im 25/35  brain]
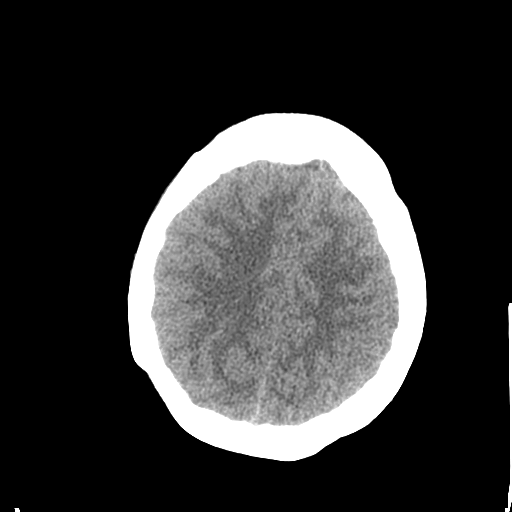
[im 30/35  brain]
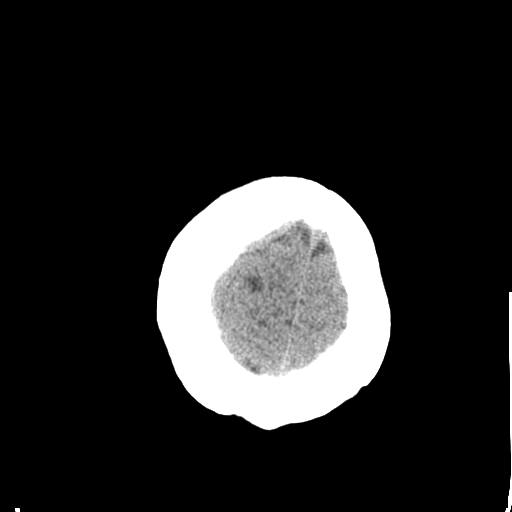
[im 30/35  bone]
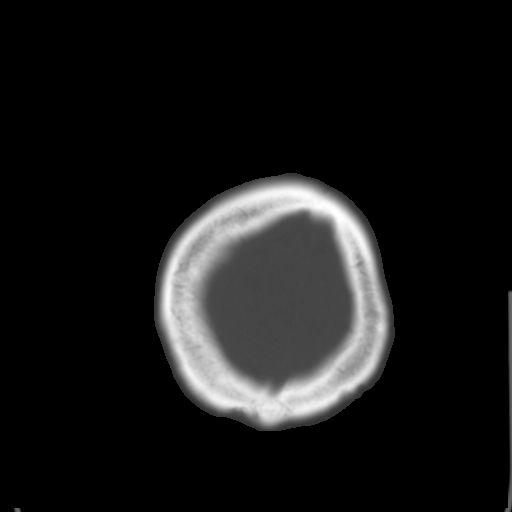
[im 32/35  brain]
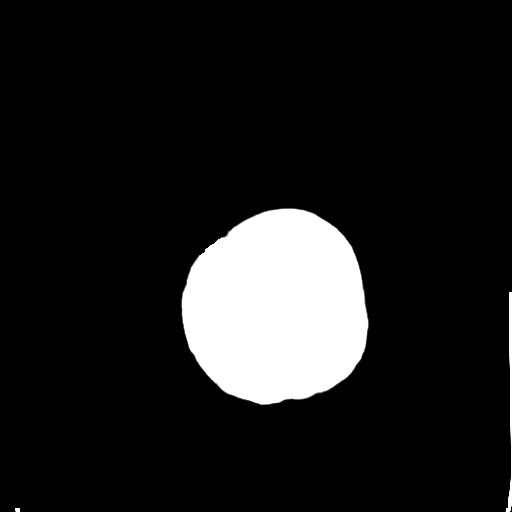

[Series 4: coronal · coronal · 0.29mm/px · 3 of 62 slices shown]
[im 21/62  brain]
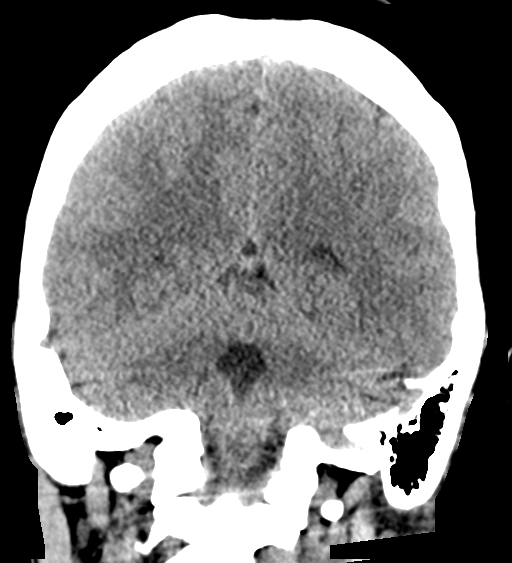
[im 28/62  brain]
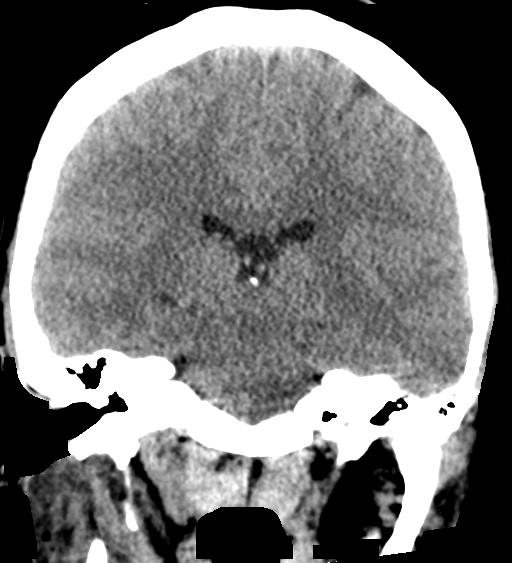
[im 34/62  brain]
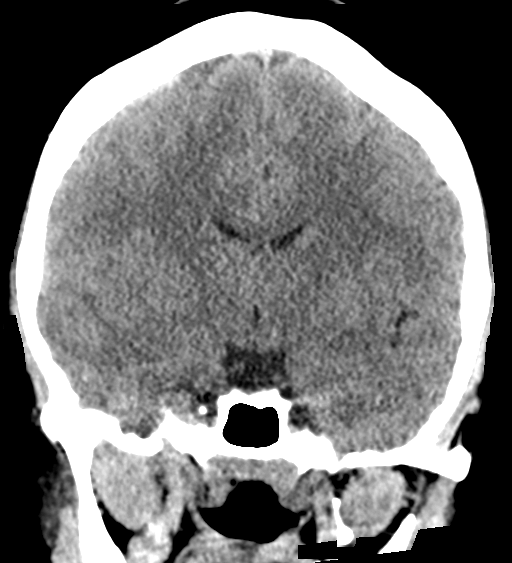

[Series 5: sagittal · sagittal · 0.33mm/px · 3 of 49 slices shown]
[im 18/49  brain]
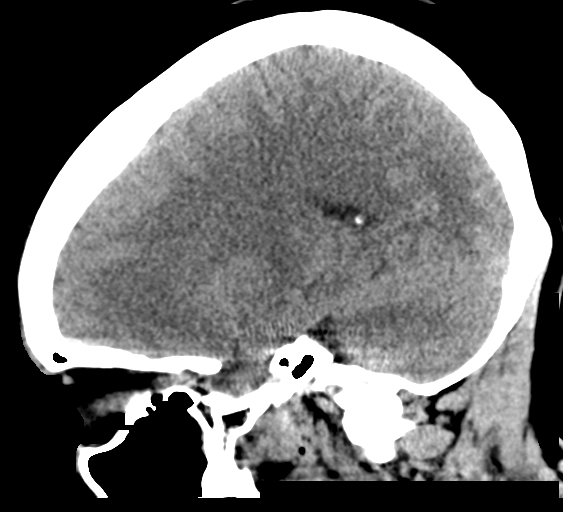
[im 25/49  brain]
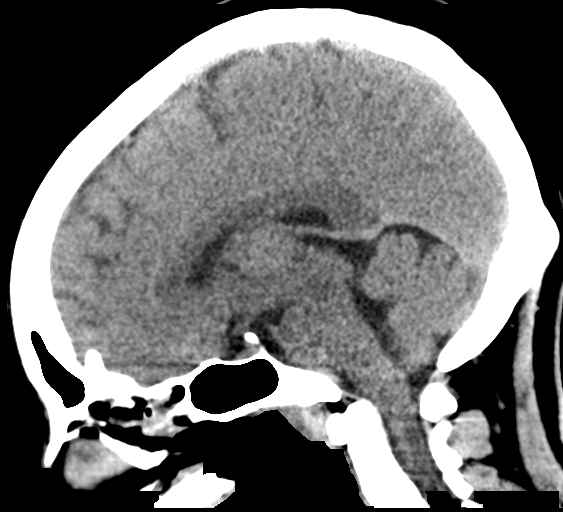
[im 31/49  brain]
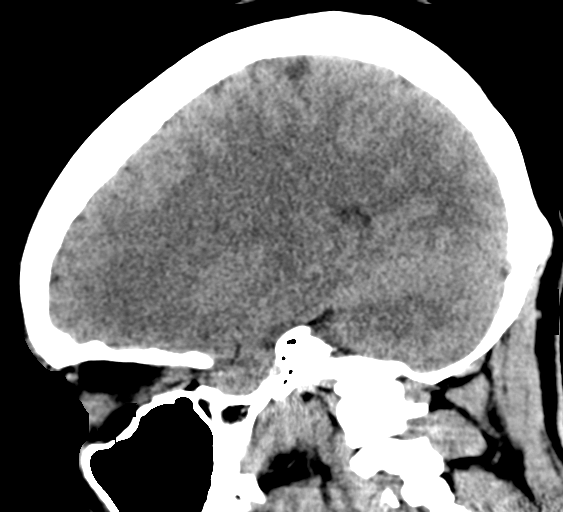

[16 of 47 positions shown; findings below may reference images not displayed]

FINDINGS: Brain: No evidence of acute infarction, hemorrhage, hydrocephalus,
extra-axial collection or mass lesion/mass effect.

Vascular: No hyperdense vessel or unexpected calcification.

Skull: Normal. Negative for fracture or focal lesion.

Sinuses/Orbits: Mucosal thickening in the paranasal sinuses with
opacification of multiple left ethmoid air cells. Diffuse
opacification of the right mastoid air cells. Left mastoid air cells
are clear.

Other: None.
IMPRESSION: No acute intracranial abnormalities. Right mastoid effusions.
Inflammatory changes in the paranasal sinuses.
